# Patient Record
Sex: Female | Born: 1940 | Race: White | Hispanic: No | Smoking: Former smoker
Health system: Southern US, Community
[De-identification: ages and names within clinical notes are randomized; demographics above are authoritative.]

## PROBLEM LIST (undated history)

## (undated) DIAGNOSIS — Z972 Presence of dental prosthetic device (complete) (partial): Secondary | ICD-10-CM

## (undated) DIAGNOSIS — R21 Rash and other nonspecific skin eruption: Secondary | ICD-10-CM

## (undated) DIAGNOSIS — L930 Discoid lupus erythematosus: Secondary | ICD-10-CM

## (undated) DIAGNOSIS — D649 Anemia, unspecified: Secondary | ICD-10-CM

## (undated) DIAGNOSIS — E785 Hyperlipidemia, unspecified: Secondary | ICD-10-CM

## (undated) DIAGNOSIS — I82409 Acute embolism and thrombosis of unspecified deep veins of unspecified lower extremity: Secondary | ICD-10-CM

## (undated) DIAGNOSIS — R011 Cardiac murmur, unspecified: Secondary | ICD-10-CM

## (undated) DIAGNOSIS — M1990 Unspecified osteoarthritis, unspecified site: Secondary | ICD-10-CM

## (undated) DIAGNOSIS — Z8669 Personal history of other diseases of the nervous system and sense organs: Secondary | ICD-10-CM

## (undated) DIAGNOSIS — I10 Essential (primary) hypertension: Secondary | ICD-10-CM

## (undated) DIAGNOSIS — D689 Coagulation defect, unspecified: Secondary | ICD-10-CM

## (undated) HISTORY — DX: Unspecified osteoarthritis, unspecified site: M19.90

## (undated) HISTORY — DX: Personal history of other diseases of the nervous system and sense organs: Z86.69

## (undated) HISTORY — PX: EYE SURGERY: SHX253

## (undated) HISTORY — DX: Hyperlipidemia, unspecified: E78.5

## (undated) HISTORY — DX: Discoid lupus erythematosus: L93.0

## (undated) HISTORY — DX: Acute embolism and thrombosis of unspecified deep veins of unspecified lower extremity: I82.409

## (undated) HISTORY — DX: Anemia, unspecified: D64.9

## (undated) HISTORY — DX: Essential (primary) hypertension: I10

## (undated) HISTORY — DX: Rash and other nonspecific skin eruption: R21

## (undated) HISTORY — DX: Coagulation defect, unspecified: D68.9

---

## 1974-09-27 DIAGNOSIS — T8859XA Other complications of anesthesia, initial encounter: Secondary | ICD-10-CM

## 1974-09-27 HISTORY — DX: Other complications of anesthesia, initial encounter: T88.59XA

## 1983-09-28 HISTORY — PX: ABDOMINAL HYSTERECTOMY: SHX81

## 1985-09-27 HISTORY — PX: CHOLECYSTECTOMY: SHX55

## 1987-09-28 HISTORY — PX: OTHER SURGICAL HISTORY: SHX169

## 2005-02-01 ENCOUNTER — Ambulatory Visit: Payer: Self-pay | Admitting: Internal Medicine

## 2005-02-12 ENCOUNTER — Emergency Department: Payer: Self-pay | Admitting: Unknown Physician Specialty

## 2005-09-27 HISTORY — PX: KNEE ARTHROSCOPY: SUR90

## 2006-05-07 IMAGING — CR PELVIS - 1-2 VIEW
1 series · 1 of 1 positions shown · non-contrast
Comparison: none

REASON FOR EXAM: Injury, pain
COMMENTS:

PROCEDURE:     DXR - DXR PELVIS AP ONLY  - February 12, 2005  [DATE]
RESULT:     A single AP view reveals the pelvic girdle to be intact. No
acute fractures are noted.

[view not recorded]
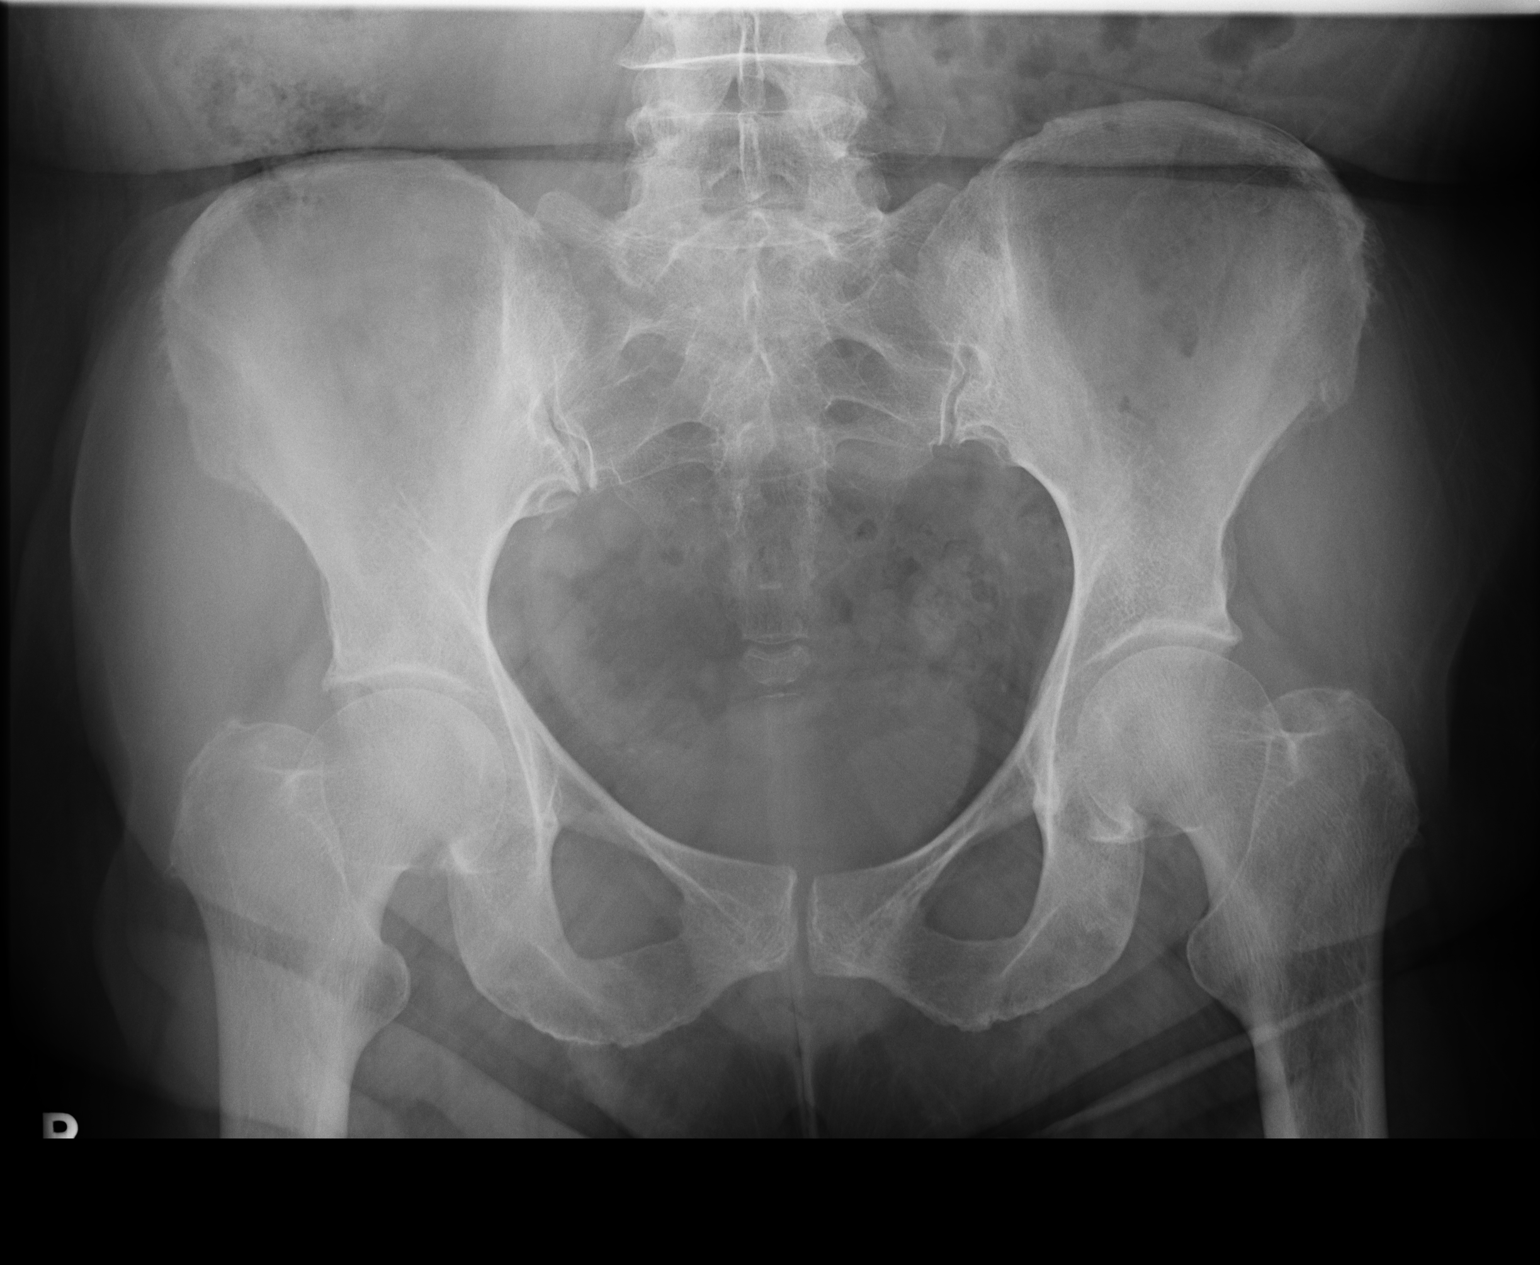

[1 of 1 positions shown; findings below may reference images not displayed]

IMPRESSION: No fracture is seen of the pelvis.

## 2006-05-07 IMAGING — CR RIGHT HIP - COMPLETE 2+ VIEW
1 series · 3 of 3 positions shown · non-contrast
Comparison: none

REASON FOR EXAM: injury, pain
COMMENTS:

PROCEDURE:     DXR - DXR HIP RIGHT COMPLETE  - February 12, 2005  [DATE]
RESULT:      AP and lateral view reveals the RIGHT hip to be intact.  No
fractures are identified.  The joint space is intact.

[Series 1: view not recorded · 0.17mm/px · 3 of 3 slices shown]
[im 1/3]
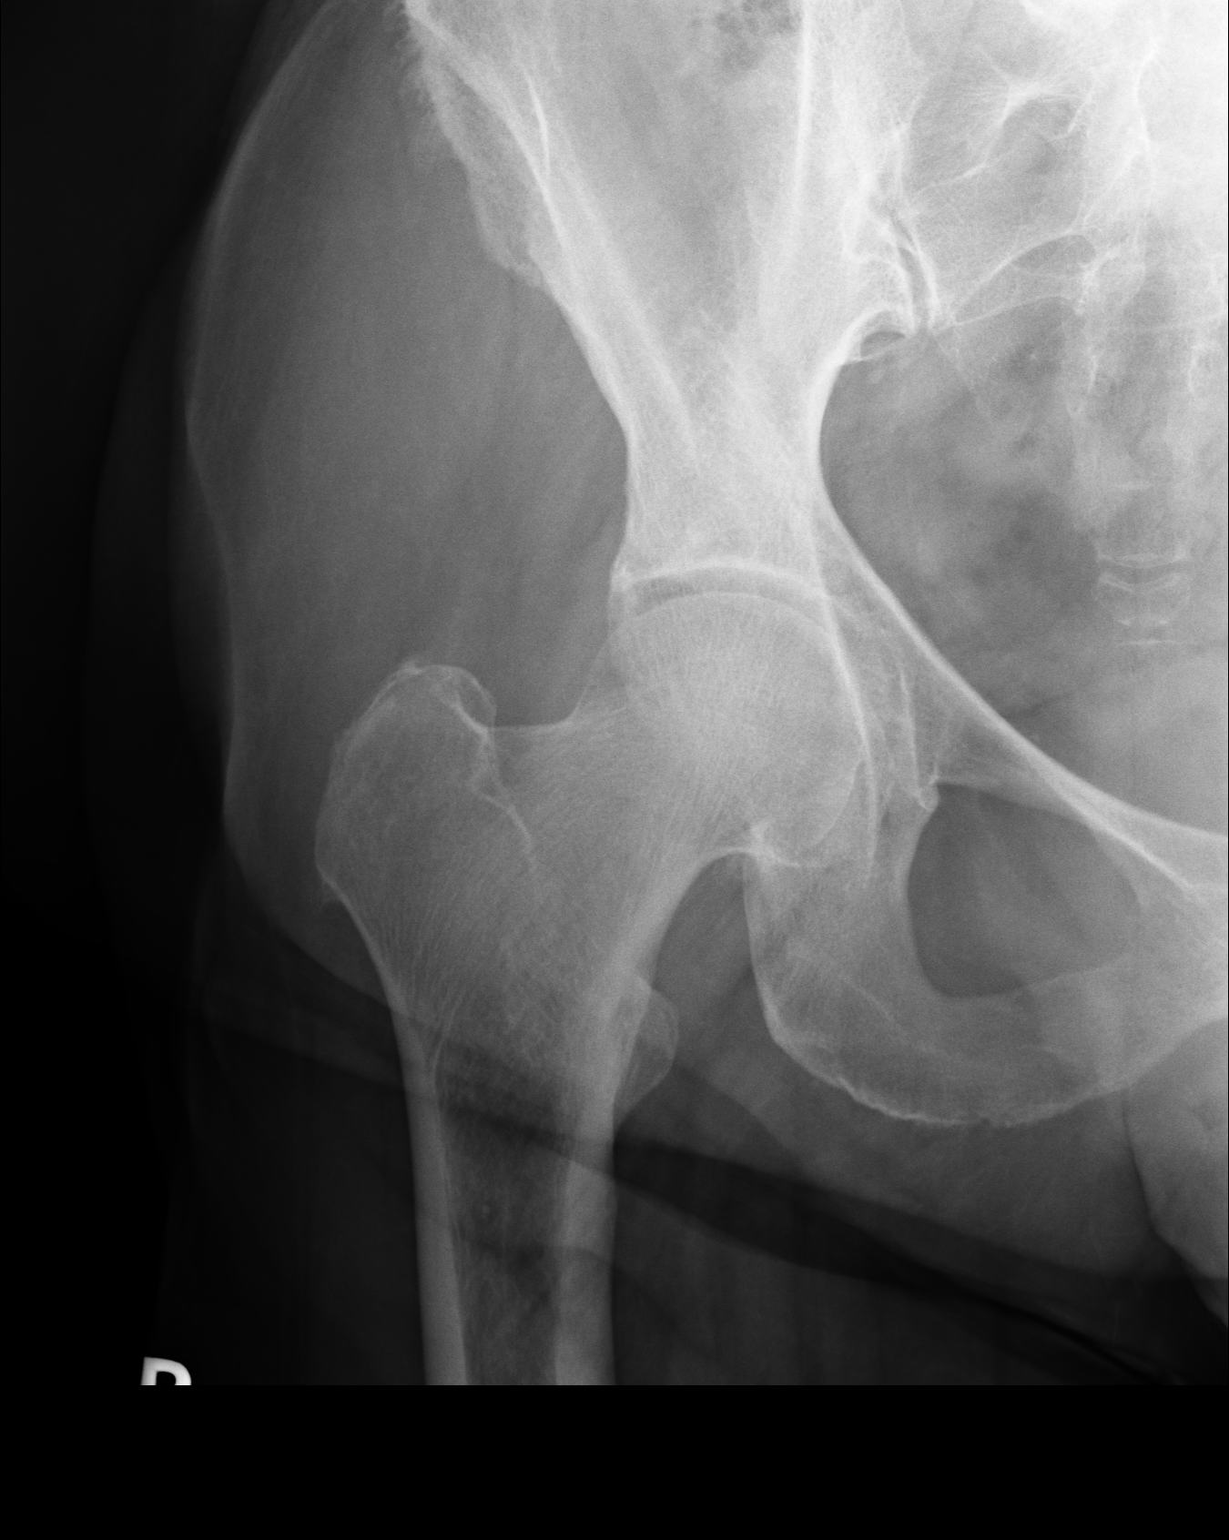
[im 2/3]
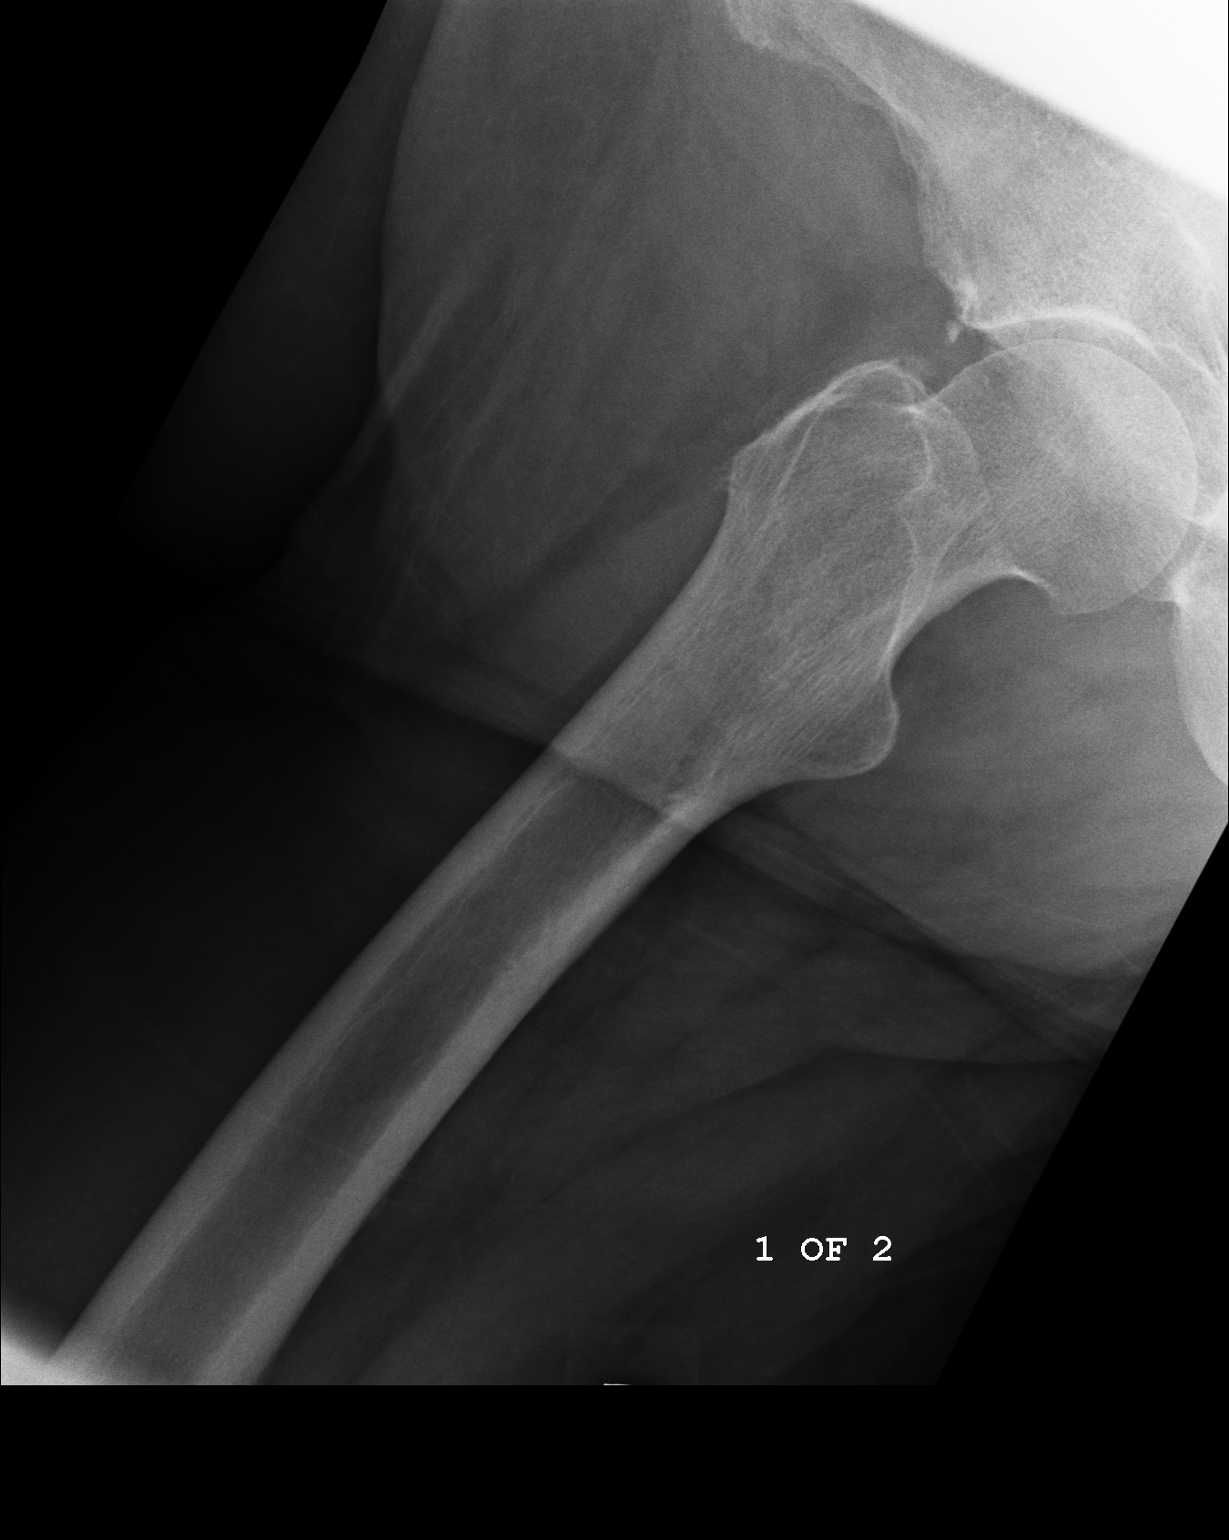
[im 3/3]
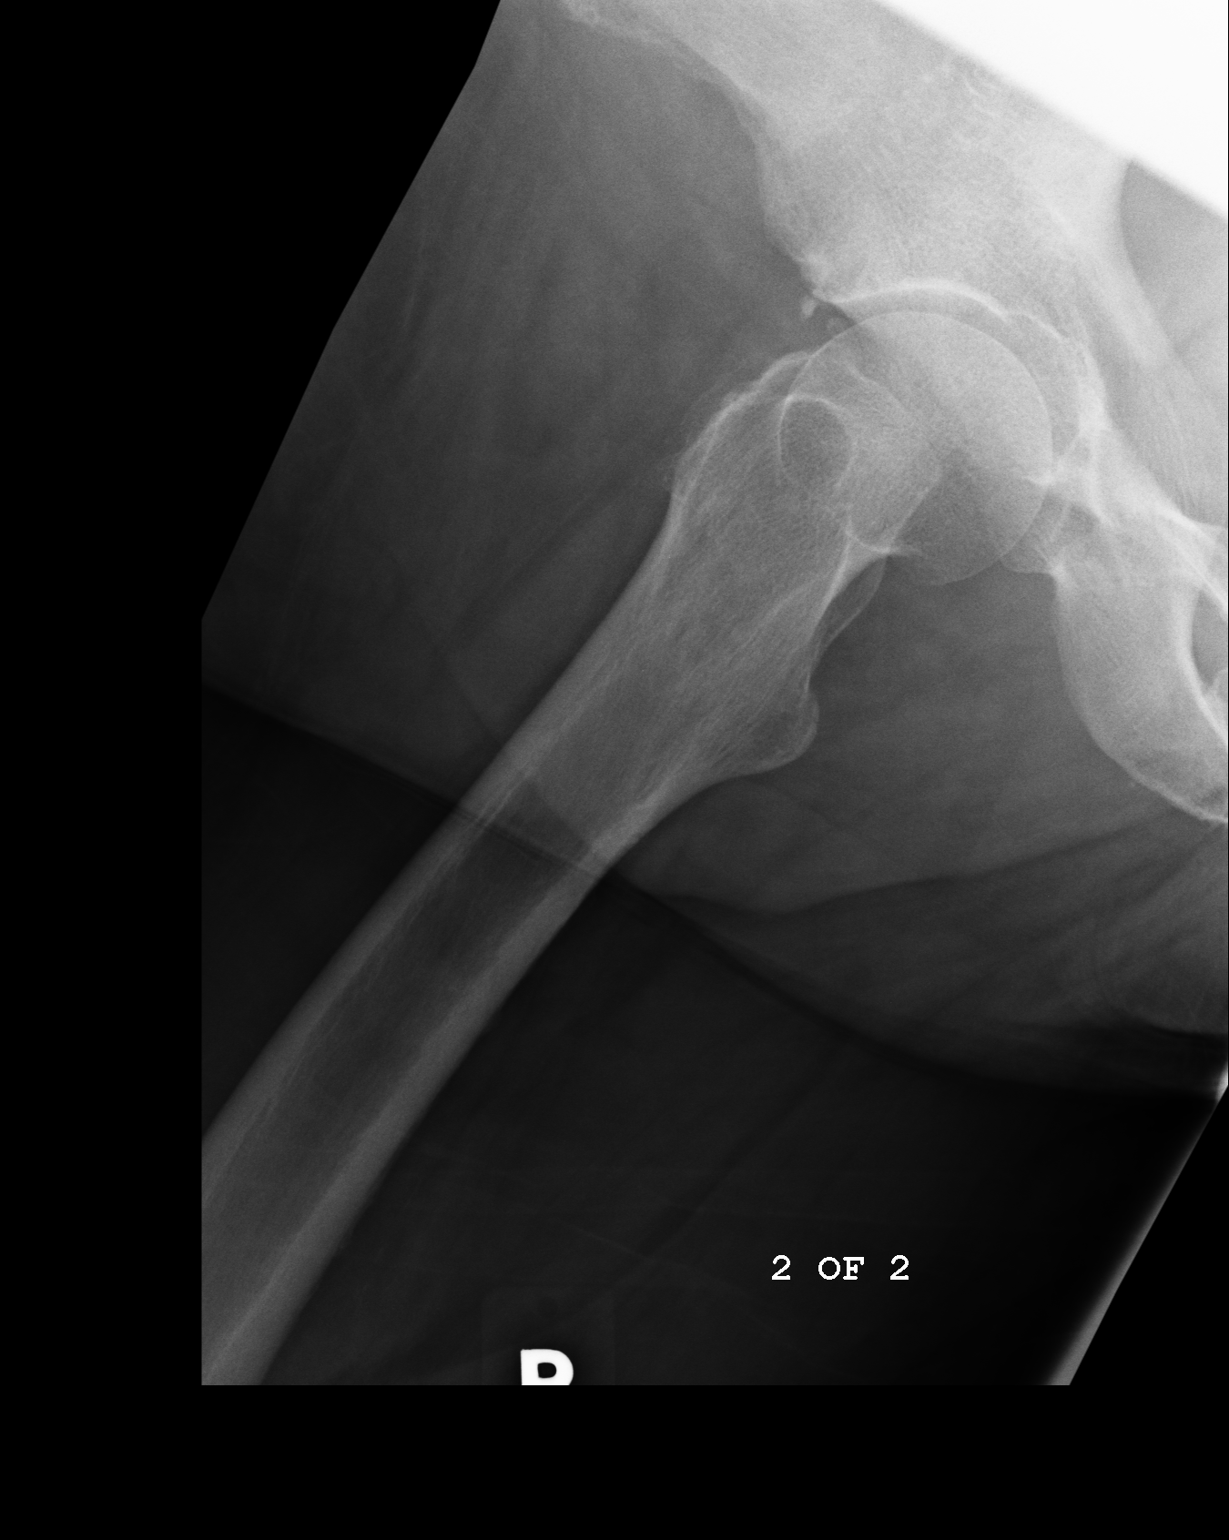

[3 of 3 positions shown; findings below may reference images not displayed]

IMPRESSION: No acute fracture noted of the RIGHT hip.

## 2006-05-07 IMAGING — CR DG KNEE COMPLETE 4+V*R*
1 series · 4 of 4 positions shown · non-contrast
Comparison: none

REASON FOR EXAM: injury, pain
COMMENTS:

PROCEDURE:     DXR - DXR KNEE RT COMP WITH OBLIQUES  - February 12, 2005  [DATE]
RESULT:       Four views reveals no fractures or dislocations. The joint
space is intact.

[Series 1: view not recorded · 0.17mm/px · 4 of 4 slices shown]
[im 1/4]
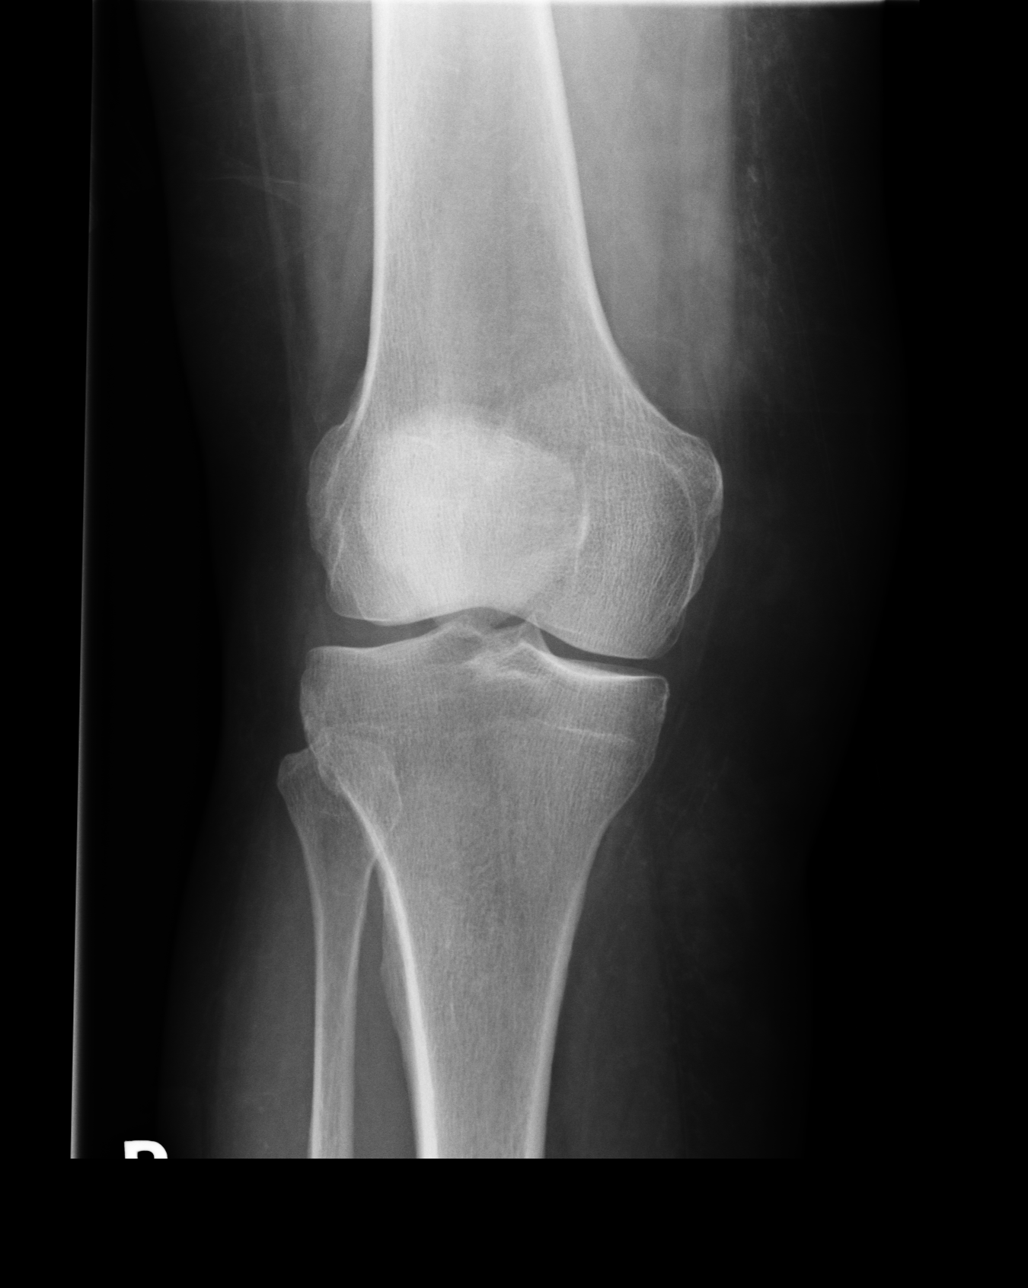
[im 2/4]
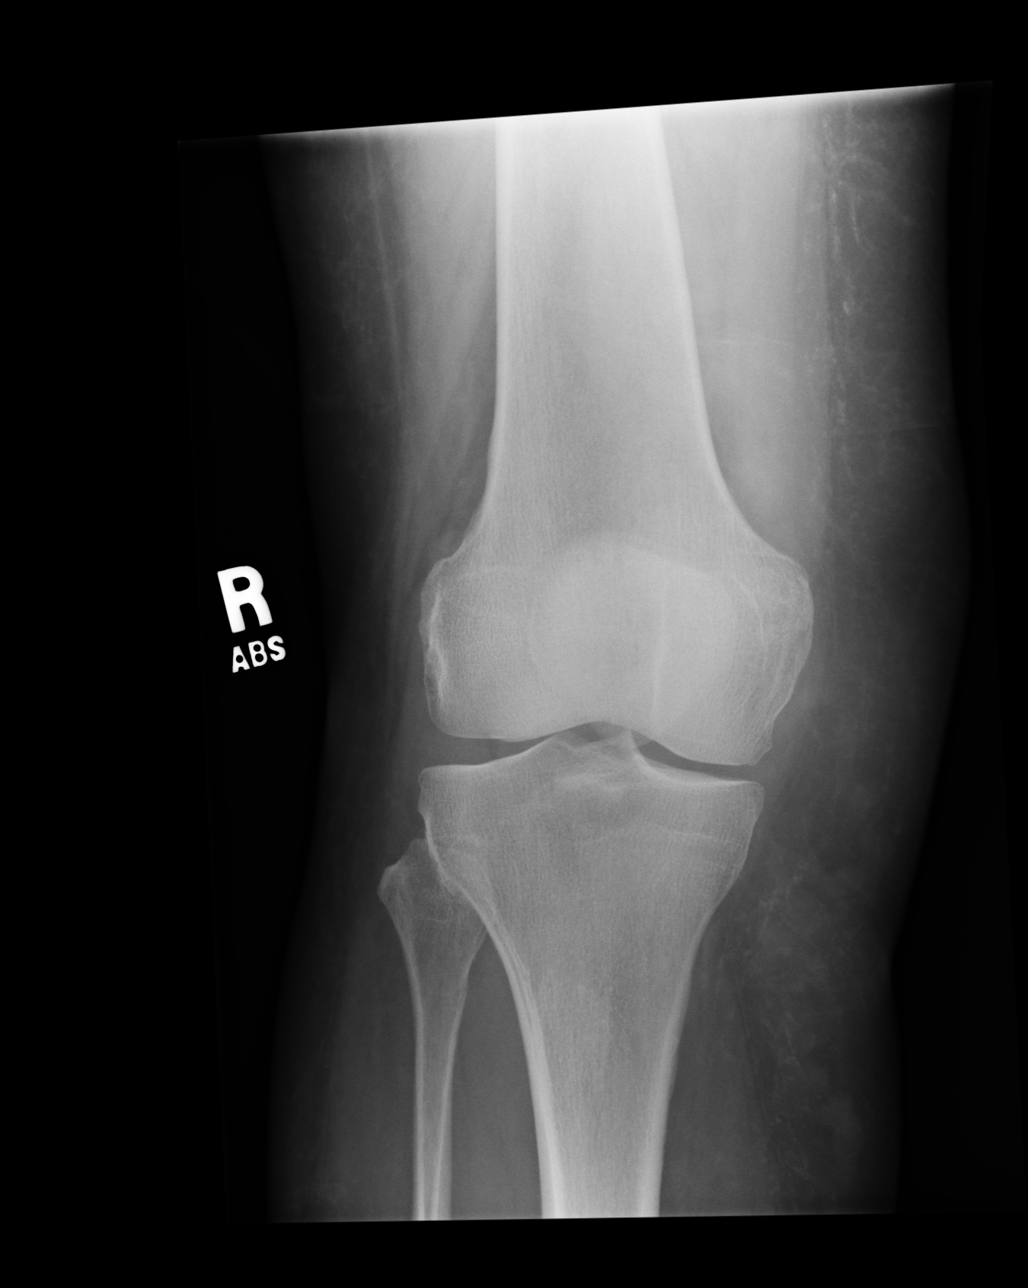
[im 3/4]
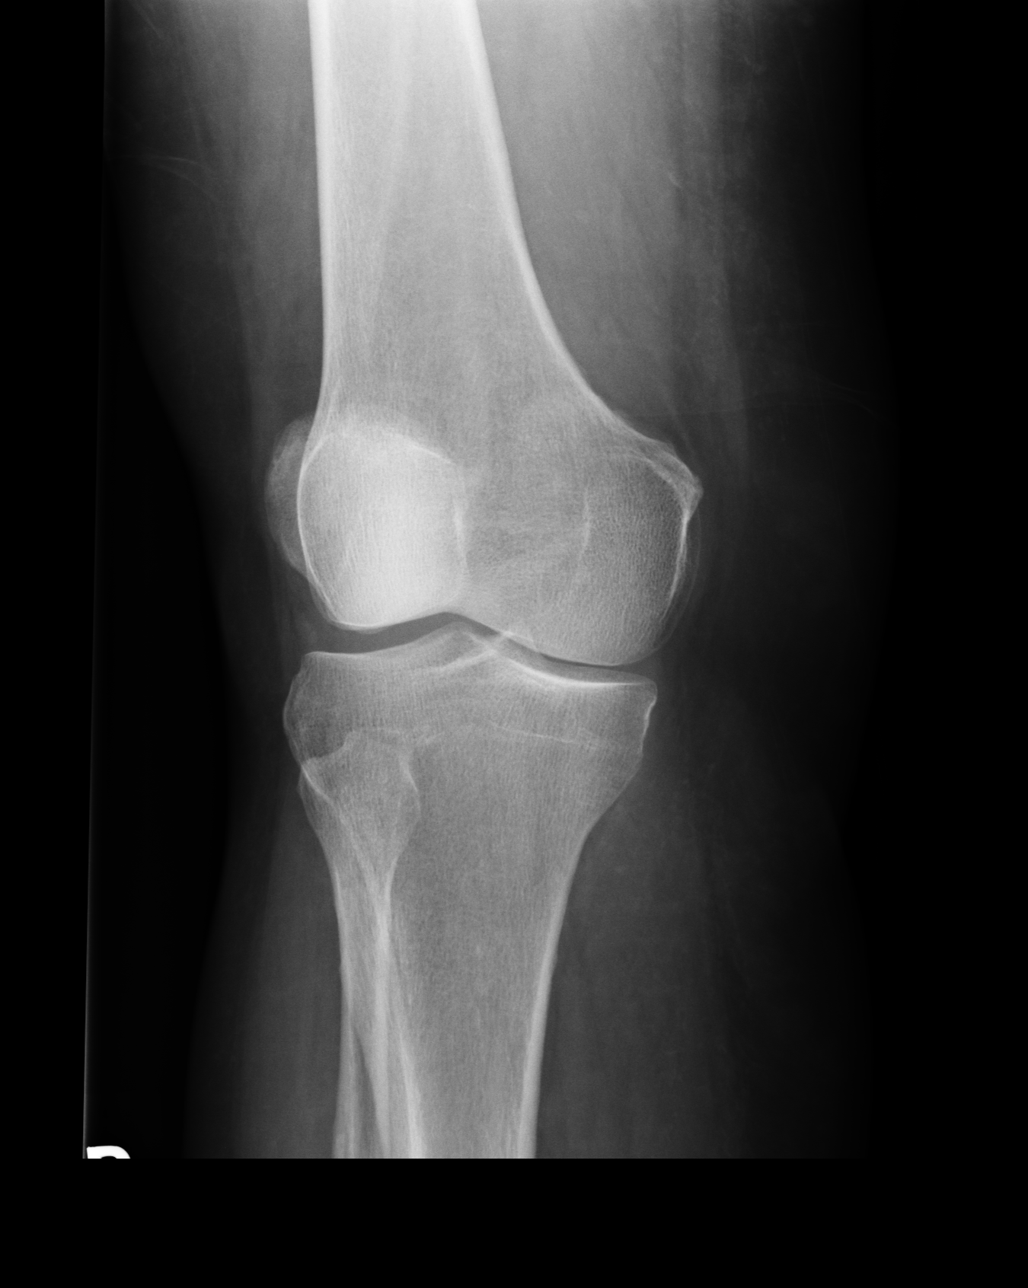
[im 4/4]
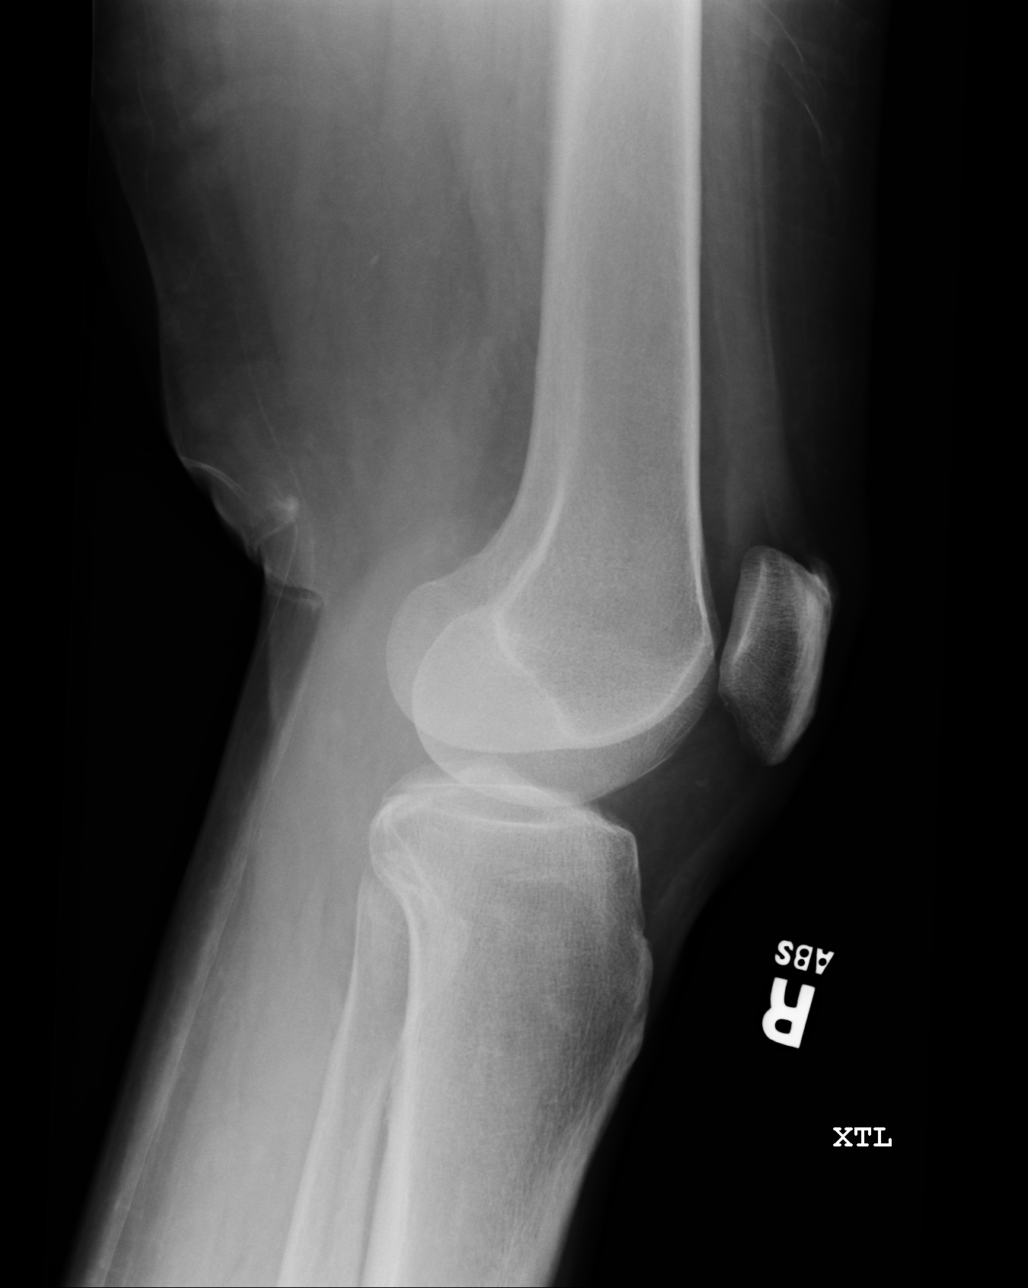

[4 of 4 positions shown; findings below may reference images not displayed]

IMPRESSION: No acute fracture seen of the RIGHT knee.

## 2006-06-20 ENCOUNTER — Emergency Department: Payer: Self-pay | Admitting: Emergency Medicine

## 2006-07-19 ENCOUNTER — Ambulatory Visit (HOSPITAL_BASED_OUTPATIENT_CLINIC_OR_DEPARTMENT_OTHER): Admission: RE | Admit: 2006-07-19 | Discharge: 2006-07-19 | Payer: Self-pay | Admitting: Orthopedic Surgery

## 2007-09-28 HISTORY — PX: KNEE ARTHROSCOPY: SUR90

## 2007-10-24 ENCOUNTER — Ambulatory Visit: Payer: Self-pay | Admitting: Internal Medicine

## 2007-12-05 ENCOUNTER — Encounter: Payer: Self-pay | Admitting: Internal Medicine

## 2007-12-27 ENCOUNTER — Encounter: Payer: Self-pay | Admitting: Internal Medicine

## 2008-12-10 ENCOUNTER — Ambulatory Visit: Payer: Self-pay | Admitting: Internal Medicine

## 2009-06-10 ENCOUNTER — Ambulatory Visit: Payer: Self-pay | Admitting: Internal Medicine

## 2010-09-16 ENCOUNTER — Ambulatory Visit: Payer: Self-pay | Admitting: Internal Medicine

## 2011-05-14 ENCOUNTER — Encounter: Payer: Self-pay | Admitting: Internal Medicine

## 2011-05-26 ENCOUNTER — Encounter: Payer: Self-pay | Admitting: Internal Medicine

## 2011-05-26 ENCOUNTER — Ambulatory Visit (INDEPENDENT_AMBULATORY_CARE_PROVIDER_SITE_OTHER): Payer: Medicare Other | Admitting: Internal Medicine

## 2011-05-26 DIAGNOSIS — M129 Arthropathy, unspecified: Secondary | ICD-10-CM

## 2011-05-26 DIAGNOSIS — I10 Essential (primary) hypertension: Secondary | ICD-10-CM | POA: Insufficient documentation

## 2011-05-26 DIAGNOSIS — M1990 Unspecified osteoarthritis, unspecified site: Secondary | ICD-10-CM | POA: Insufficient documentation

## 2011-05-26 DIAGNOSIS — R5383 Other fatigue: Secondary | ICD-10-CM | POA: Insufficient documentation

## 2011-05-26 DIAGNOSIS — IMO0002 Reserved for concepts with insufficient information to code with codable children: Secondary | ICD-10-CM | POA: Insufficient documentation

## 2011-05-26 DIAGNOSIS — N329 Bladder disorder, unspecified: Secondary | ICD-10-CM

## 2011-05-26 DIAGNOSIS — Z79899 Other long term (current) drug therapy: Secondary | ICD-10-CM | POA: Insufficient documentation

## 2011-05-26 LAB — COMPREHENSIVE METABOLIC PANEL
ALT: 13 U/L (ref 0–35)
AST: 17 U/L (ref 0–37)
Albumin: 4.1 g/dL (ref 3.5–5.2)
BUN: 11 mg/dL (ref 6–23)
Calcium: 9.6 mg/dL (ref 8.4–10.5)
Chloride: 103 mEq/L (ref 96–112)
Potassium: 3.9 mEq/L (ref 3.5–5.1)

## 2011-05-26 LAB — SEDIMENTATION RATE: Sed Rate: 19 mm/hr (ref 0–22)

## 2011-05-26 MED ORDER — LISINOPRIL 20 MG PO TABS: 40.0000 mg | ORAL_TABLET | Freq: Every day | ORAL | Status: DC

## 2011-05-26 NOTE — Assessment & Plan Note (Signed)
seocndary to overuse, involving both wrists and thumbs with prior steroid injections and wrist splints providing no relief and acutally aggravating her pain..  She does not desire orthopedic eval at this tine.  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

## 2011-05-26 NOTE — Patient Instructions (Signed)
If your wrist pain worsens or your bladder pain worsens, please call the office. We will call you with your lab results. Return in December for a physical .

## 2011-05-26 NOTE — Assessment & Plan Note (Signed)
Checking lytes , liver and kidney function today for chronic use of ACE I , HCTZ and red yeast rice.

## 2011-05-26 NOTE — Assessment & Plan Note (Signed)
Not well controlled by recent home checks,  Incresae lisinopril to 40 mg daily and continue hctz 25 mg daily

## 2011-05-26 NOTE — Assessment & Plan Note (Signed)
Chronic, with 70 lb weight loss over 5 years, good sleep habits,  checking TSH

## 2011-05-26 NOTE — Progress Notes (Signed)
  Subjective:    Patient ID: Breanna Hodge, female    DOB: 1940-11-29, 70 y.o.   MRN: [id number]  HPI  70 yr old white female with histoyr of HTN , bladder prolapse s.p mesh repair over 2 0 yrs ago presents for followup on chronoic complaints.  Left wrist,  bladder pain,  diarrhea now improved since stopping b12 supplements and avoiding gluten.  Weight loss of 70 lbs now stabilized.  Husband is on dailysis,  She is taking care of one grandchild.  Sleeping 7 hours/night.    Review of Systems  Constitutional: Positive for fatigue. Negative for fever, chills and unexpected weight change.  HENT: Negative for hearing loss, ear pain, nosebleeds, congestion, sore throat, facial swelling, rhinorrhea, sneezing, mouth sores, trouble swallowing, neck pain, neck stiffness, voice change, postnasal drip, sinus pressure, tinnitus and ear discharge.   Eyes: Negative for pain, discharge, redness and visual disturbance.  Respiratory: Negative for cough, chest tightness, shortness of breath, wheezing and stridor.   Cardiovascular: Negative for chest pain, palpitations and leg swelling.  Gastrointestinal: Negative for blood in stool.  Musculoskeletal: Positive for arthralgias. Negative for myalgias.  Skin: Negative for color change and rash.  Neurological: Positive for facial asymmetry. Negative for dizziness, weakness, light-headedness and headaches.  Hematological: Negative for adenopathy.       Objective:   Physical Exam  Constitutional: She is oriented to person, place, and time. She appears well-developed and well-nourished.  HENT:  Mouth/Throat: Oropharynx is clear and moist.  Eyes: EOM are normal. Pupils are equal, round, and reactive to light. No scleral icterus.  Neck: Normal range of motion. Neck supple. No JVD present. No thyromegaly present.  Cardiovascular: Normal rate, regular rhythm and intact distal pulses.  Exam reveals gallop.   Pulmonary/Chest: Effort normal and breath sounds  normal.  Abdominal: Soft. Bowel sounds are normal. She exhibits no mass. There is no tenderness.  Musculoskeletal: She exhibits edema.       Arms:      Trace , RLE  Lymphadenopathy:    She has no cervical adenopathy.  Neurological: She is alert and oriented to person, place, and time.  Skin: Skin is warm and dry. Rash noted.       hyperigmented macular rash RLE on shin  Psychiatric: She has a normal mood and affect. Judgment and thought content normal.          Assessment & Plan:

## 2011-05-26 NOTE — Assessment & Plan Note (Signed)
She had an E Coli UTI in April, now resolved,  But has recurrent bladder pain which she attributes to prior mesh placement 20 yrs ago which she believes has fallen.  She wants to defer urologic evaluation until January and prefers to see Dr. Achilles Dunk since he has cared for her husband. Will initiate referral in Dec during her annual physical.

## 2011-06-09 ENCOUNTER — Encounter: Payer: Self-pay | Admitting: Internal Medicine

## 2011-07-02 ENCOUNTER — Ambulatory Visit (INDEPENDENT_AMBULATORY_CARE_PROVIDER_SITE_OTHER): Payer: Medicare Other | Admitting: Internal Medicine

## 2011-07-02 DIAGNOSIS — Z23 Encounter for immunization: Secondary | ICD-10-CM

## 2012-02-13 ENCOUNTER — Other Ambulatory Visit: Payer: Self-pay | Admitting: Internal Medicine

## 2012-03-28 ENCOUNTER — Encounter: Payer: Self-pay | Admitting: Internal Medicine

## 2012-03-28 ENCOUNTER — Ambulatory Visit (INDEPENDENT_AMBULATORY_CARE_PROVIDER_SITE_OTHER): Payer: Medicare Other | Admitting: Internal Medicine

## 2012-03-28 VITALS — BP 144/82 | HR 80 | Temp 98.0°F | Resp 16 | Wt 175.5 lb

## 2012-03-28 DIAGNOSIS — R5383 Other fatigue: Secondary | ICD-10-CM

## 2012-03-28 DIAGNOSIS — Z23 Encounter for immunization: Secondary | ICD-10-CM

## 2012-03-28 DIAGNOSIS — Z1239 Encounter for other screening for malignant neoplasm of breast: Secondary | ICD-10-CM

## 2012-03-28 DIAGNOSIS — I10 Essential (primary) hypertension: Secondary | ICD-10-CM

## 2012-03-28 DIAGNOSIS — E785 Hyperlipidemia, unspecified: Secondary | ICD-10-CM

## 2012-03-28 MED ORDER — HYDROCHLOROTHIAZIDE 25 MG PO TABS: 25.0000 mg | ORAL_TABLET | Freq: Every day | ORAL | Status: DC

## 2012-03-28 MED ORDER — LISINOPRIL 20 MG PO TABS: 40.0000 mg | ORAL_TABLET | Freq: Every day | ORAL | Status: DC

## 2012-03-28 NOTE — Patient Instructions (Addendum)
Return in a fasting state for your labs   We gave you the pneumovax vaccine today and will check on your TdaP status .

## 2012-03-28 NOTE — Progress Notes (Signed)
Patient ID: Breanna Hodge, female   DOB: Oct 25, 1940, 71 y.o.   MRN: [id number]  Subjective:    Patient here for follow-up of elevated blood pressure.  She is not exercising and is adherent to a low-salt diet.  Blood pressure is well controlled at home. Cardiac symptoms: none. Patient denies: claudication, dyspnea, exertional chest pressure/discomfort, lower extremity edema, orthopnea and palpitations. Cardiovascular risk factors: advanced age (older than 60 for men, 38 for women) and dyslipidemia. Use of agents associated with hypertension: none. History of target organ damage: none.  The following portions of the patient's history were reviewed and updated as appropriate: allergies, current medications, past family history, past medical history, past social history, past surgical history and problem list.  Review of Systems Musculoskeletal:positive for arthralgias and stiff joints     Objective:    BP 144/82  Pulse 80  Temp 98 F (36.7 C) (Oral)  Resp 16  Wt 175 lb 8 oz (79.606 kg)  SpO2 97% General appearance: alert, cooperative and appears stated age Neck: no adenopathy, no carotid bruit, no JVD, supple, symmetrical, trachea midline and thyroid not enlarged, symmetric, no tenderness/mass/nodules Lungs: clear to auscultation bilaterally Heart: regular rate and rhythm, S1, S2 normal, no murmur, click, rub or gallop Extremities: extremities normal, atraumatic, no cyanosis or edema Pulses: 2+ and symmetric    Assessment:    Hypertension, stage 2 Evidence of target organ damage: none.    Plan:    Medication: no change.

## 2012-04-04 ENCOUNTER — Ambulatory Visit: Payer: Self-pay | Admitting: Internal Medicine

## 2012-04-05 ENCOUNTER — Telehealth: Payer: Self-pay | Admitting: Internal Medicine

## 2012-04-05 NOTE — Telephone Encounter (Signed)
Her mammogram was normal.  Repeat in one year 

## 2012-04-06 NOTE — Telephone Encounter (Signed)
Left message asking patient to return my call.

## 2012-04-07 NOTE — Telephone Encounter (Signed)
Patient notified

## 2012-04-08 LAB — HM MAMMOGRAPHY: HM Mammogram: NORMAL

## 2012-04-18 ENCOUNTER — Encounter: Payer: Self-pay | Admitting: Internal Medicine

## 2012-05-09 ENCOUNTER — Encounter: Payer: Self-pay | Admitting: Internal Medicine

## 2012-05-09 ENCOUNTER — Ambulatory Visit (INDEPENDENT_AMBULATORY_CARE_PROVIDER_SITE_OTHER): Payer: Medicare Other | Admitting: Internal Medicine

## 2012-05-09 VITALS — BP 130/76 | HR 76 | Temp 97.6°F | Resp 18 | Ht 66.5 in | Wt 172.2 lb

## 2012-05-09 DIAGNOSIS — IMO0002 Reserved for concepts with insufficient information to code with codable children: Secondary | ICD-10-CM

## 2012-05-09 DIAGNOSIS — Z79899 Other long term (current) drug therapy: Secondary | ICD-10-CM

## 2012-05-09 DIAGNOSIS — Z1211 Encounter for screening for malignant neoplasm of colon: Secondary | ICD-10-CM

## 2012-05-09 DIAGNOSIS — E559 Vitamin D deficiency, unspecified: Secondary | ICD-10-CM

## 2012-05-09 DIAGNOSIS — R5383 Other fatigue: Secondary | ICD-10-CM

## 2012-05-09 DIAGNOSIS — N8111 Cystocele, midline: Secondary | ICD-10-CM

## 2012-05-09 DIAGNOSIS — E785 Hyperlipidemia, unspecified: Secondary | ICD-10-CM

## 2012-05-09 DIAGNOSIS — I10 Essential (primary) hypertension: Secondary | ICD-10-CM

## 2012-05-09 DIAGNOSIS — R5381 Other malaise: Secondary | ICD-10-CM

## 2012-05-09 LAB — COMPREHENSIVE METABOLIC PANEL
Albumin: 4.2 g/dL (ref 3.5–5.2)
Alkaline Phosphatase: 48 U/L (ref 39–117)
BUN: 14 mg/dL (ref 6–23)
Calcium: 9.7 mg/dL (ref 8.4–10.5)
Creatinine, Ser: 0.6 mg/dL (ref 0.4–1.2)
Glucose, Bld: 97 mg/dL (ref 70–99)
Potassium: 3.2 mEq/L — ABNORMAL LOW (ref 3.5–5.1)

## 2012-05-09 LAB — CBC WITH DIFFERENTIAL/PLATELET
Basophils Relative: 0.7 % (ref 0.0–3.0)
Eosinophils Absolute: 0.4 10*3/uL (ref 0.0–0.7)
Eosinophils Relative: 4 % (ref 0.0–5.0)
HCT: 44.3 % (ref 36.0–46.0)
Hemoglobin: 14.6 g/dL (ref 12.0–15.0)
MCHC: 33 g/dL (ref 30.0–36.0)
MCV: 93.7 fl (ref 78.0–100.0)
Monocytes Absolute: 0.9 10*3/uL (ref 0.1–1.0)
Neutro Abs: 5.9 10*3/uL (ref 1.4–7.7)
Neutrophils Relative %: 56.7 % (ref 43.0–77.0)
RBC: 4.72 Mil/uL (ref 3.87–5.11)
WBC: 10.3 10*3/uL (ref 4.5–10.5)

## 2012-05-09 LAB — LIPID PANEL: HDL: 53.3 mg/dL (ref >39.00)

## 2012-05-09 LAB — TSH: TSH: 1.57 u[IU]/mL (ref 0.35–5.50)

## 2012-05-09 NOTE — Patient Instructions (Addendum)
Please complete the take home stool cards as your annual screening test for colon CA  We will call you with the result of your blood work today and whether you need to get the TdaP vaccine at the health Dept   Please consider letting us schedule you for a Bone Density test

## 2012-05-10 ENCOUNTER — Other Ambulatory Visit: Payer: Self-pay | Admitting: *Deleted

## 2012-05-10 DIAGNOSIS — Z1211 Encounter for screening for malignant neoplasm of colon: Secondary | ICD-10-CM | POA: Insufficient documentation

## 2012-05-10 LAB — VITAMIN D 25 HYDROXY (VIT D DEFICIENCY, FRACTURES): Vit D, 25-Hydroxy: 47 ng/mL (ref 30–89)

## 2012-05-10 LAB — LDL CHOLESTEROL, DIRECT: Direct LDL: 189.9 mg/dL

## 2012-05-10 MED ORDER — POTASSIUM CHLORIDE CRYS ER 20 MEQ PO TBCR: 20.0000 meq | EXTENDED_RELEASE_TABLET | Freq: Every day | ORAL | Status: DC

## 2012-05-10 NOTE — Assessment & Plan Note (Signed)
Patient states thta her blood pressures at home been consistently 130/80. She has historically been resistant to adding additional medications given this fact. No changes were made today.

## 2012-05-10 NOTE — Assessment & Plan Note (Signed)
She has avoided screening for colon cancer since age 71. No prior colonoscopy. Reasons include persistently poor health of husband and his frequent medical visits. I discussed starting with take home stool cards which she has agreed to.

## 2012-05-10 NOTE — Assessment & Plan Note (Signed)
She is due for fasting labs and CMET. Thyroid and CBC will also be checked.

## 2012-05-10 NOTE — Assessment & Plan Note (Signed)
System despite 2 prior procedures she still has cystocele noted on exam. She does not want additional intervention at this time.

## 2012-05-10 NOTE — Progress Notes (Signed)
Patient ID: Breanna Hodge, female   DOB: 07-20-1941, 71 y.o.   MRN: [id number]  The patient is here for annual Medicare wellness examination and management of other chronic and acute problems.   The risk factors are reflected in the social history.  The roster of all physicians providing medical care to patient - is listed in the Snapshot section of the chart.  Activities of daily living:  The patient is 100% independent in all ADLs: dressing, toileting, feeding as well as independent mobility  Home safety : The patient has smoke detectors in the home. They wear seatbelts.  There are no firearms at home. There is no violence in the home.   There is no risks for hepatitis, STDs or HIV. There is no   history of blood transfusion. They have no travel history to infectious disease endemic areas of the world.  The patient has seen their dentist in the last six month. They have seen their eye doctor in the last year. They admit to slight hearing difficulty with regard to whispered voices and some television programs.  They have deferred audiologic testing in the last year.  They do not  have excessive sun exposure. Discussed the need for sun protection: hats, long sleeves and use of sunscreen if there is significant sun exposure.   Diet: the importance of a healthy diet is discussed. They do have a healthy diet.  The benefits of regular aerobic exercise were discussed. She walks 4 times per week ,  20 minutes.   Depression screen: there are no signs or vegative symptoms of depression- irritability, change in appetite, anhedonia, sadness/tearfullness.  Cognitive assessment: the patient manages all their financial and personal affairs and is actively engaged. They could relate day,date,year and events; recalled 2/3 objects at 3 minutes; performed clock-face test normally.  The following portions of the patient's history were reviewed and updated as appropriate: allergies, current medications,  past family history, past medical history,  past surgical history, past social history  and problem list.  Visual acuity was not assessed per patient preference since she has regular follow up with her ophthalmologist. Hearing and body mass index were assessed and reviewed.   During the course of the visit the patient was educated and counseled about appropriate screening and preventive services including : fall prevention , diabetes screening, nutrition counseling, colorectal cancer screening, and recommended immunizations.    Objective BP 130/76  Pulse 76  Temp 97.6 F (36.4 C) (Oral)  Resp 18  Ht 5' 6.5" (1.689 m)  Wt 172 lb 4 oz (78.132 kg)  BMI 27.39 kg/m2  SpO2 95%  General appearance: alert, cooperative and appears stated age Ears: normal TM's and external ear canals both ears Throat: lips, mucosa, and tongue normal; teeth and gums normal Neck: no adenopathy, no carotid bruit, supple, symmetrical, trachea midline and thyroid not enlarged, symmetric, no tenderness/mass/nodules Back: symmetric, no curvature. ROM normal. No CVA tenderness. Breast: Symmetric no masses or nipple inversion. Lungs: clear to auscultation bilaterally Heart: regular rate and rhythm, S1, S2 normal, no murmur, click, rub or gallop Abdomen: soft, non-tender; bowel sounds normal; no masses,  no organomegaly GYN: Bladder cystocele noted in vaginal introitus. Vagina normal. Patient is status post hysterectomy. Ovaries nonpalpable. Pulses: 2+ and symmetric Skin: Skin color, texture, turgor normal. No rashes or lesions Lymph nodes: Cervical, supraclavicular, and axillary nodes normal.  Bladder cystocele System despite 2 prior procedures she still has cystocele noted on exam. She does not want additional intervention at this  time.   Encounter for long-term (current) use of other medications She is due for fasting labs and CMET. Thyroid and CBC will also be checked.  Hypertension  Patient states thta her  blood pressures at home been consistently 130/80. She has historically been resistant to adding additional medications given this fact. No changes were made today.  Screening for colon cancer She has avoided screening for colon cancer since age 48. No prior colonoscopy. Reasons include persistently poor health of husband and his frequent medical visits. I discussed starting with take home stool cards which she has agreed to.   Updated Medication List Outpatient Encounter Prescriptions as of 05/09/2012  Medication Sig Dispense Refill  . ALPHA LIPOIC ACID PO Take by mouth.      Marland Kitchen aspirin 81 MG tablet Take 81 mg by mouth daily.        . calcium-vitamin D (OSCAL 500/200 D-3) 500-200 MG-UNIT per tablet Take 2 tablets by mouth daily.       . Coenzyme Q10 (COQ-10) 200 MG CAPS 100 mg. Take one by mouth daily       . Glucosamine-Chondroit-Vit C-Mn (GLUCOSAMINE 1500 COMPLEX) CAPS Take one by mouth 2 times daily       . hydrochlorothiazide (HYDRODIURIL) 25 MG tablet Take 1 tablet (25 mg total) by mouth daily.  90 tablet  0  . KRILL OIL PO Take 50 mg's by mouth daily       . lisinopril (PRINIVIL,ZESTRIL) 20 MG tablet Take 2 tablets (40 mg total) by mouth daily.  90 tablet  3  . Multiple Vitamin (MULTIVITAMIN) tablet Take 1 tablet by mouth daily.      . Probiotic Product (PROBIOTIC FORMULA PO) Take by mouth.      . Red Yeast Rice 600 MG CAPS Take 2 capsules by mouth daily.        Marland Kitchen RESVERATROL 100 MG CAPS Take by mouth.      . DISCONTD: Cholecalciferol (VITAMIN D3) 1000 UNITS CAPS Take 1 tablet by mouth daily.        Marland Kitchen DISCONTD: gelatin 650 MG capsule Take 650 mg by mouth daily.      Marland Kitchen DISCONTD: Magnesium Oxide 500 MG TABS Take one by mouth daily       . DISCONTD: Misc Natural Products (OSTEO BI-FLEX ADV DOUBLE ST PO) Take 2 tablets by mouth daily.

## 2012-05-18 ENCOUNTER — Other Ambulatory Visit: Payer: Medicare Other

## 2012-05-18 ENCOUNTER — Other Ambulatory Visit: Payer: Self-pay | Admitting: Internal Medicine

## 2012-05-18 DIAGNOSIS — Z1211 Encounter for screening for malignant neoplasm of colon: Secondary | ICD-10-CM

## 2012-05-19 ENCOUNTER — Telehealth: Payer: Self-pay | Admitting: Internal Medicine

## 2012-05-19 NOTE — Telephone Encounter (Signed)
Caller: Breanna Hodge/Patient; Phone: [phone]; Reason for Call: Pt returning call to [facility]; please try her again at your earliest convenience.

## 2012-08-27 ENCOUNTER — Other Ambulatory Visit: Payer: Self-pay | Admitting: Internal Medicine

## 2013-01-23 ENCOUNTER — Other Ambulatory Visit: Payer: Self-pay | Admitting: Internal Medicine

## 2013-01-23 ENCOUNTER — Other Ambulatory Visit: Payer: Self-pay | Admitting: *Deleted

## 2013-01-23 MED ORDER — POTASSIUM CHLORIDE CRYS ER 20 MEQ PO TBCR: 20.0000 meq | EXTENDED_RELEASE_TABLET | Freq: Every day | ORAL | Status: DC

## 2013-03-05 ENCOUNTER — Ambulatory Visit (INDEPENDENT_AMBULATORY_CARE_PROVIDER_SITE_OTHER): Payer: Medicare Other | Admitting: General Surgery

## 2013-03-05 ENCOUNTER — Encounter: Payer: Self-pay | Admitting: General Surgery

## 2013-03-05 VITALS — BP 142/78 | HR 82 | Resp 14 | Ht 69.0 in | Wt 171.0 lb

## 2013-03-05 DIAGNOSIS — L0231 Cutaneous abscess of buttock: Secondary | ICD-10-CM

## 2013-03-05 DIAGNOSIS — L03317 Cellulitis of buttock: Secondary | ICD-10-CM

## 2013-03-05 MED ORDER — SULFAMETHOXAZOLE-TRIMETHOPRIM 800-160 MG PO TABS: 1.0000 | ORAL_TABLET | Freq: Two times a day (BID) | ORAL | Status: DC

## 2013-03-05 NOTE — Patient Instructions (Addendum)
Return in 2 weeks. Simple dressing to area -change daily and as needed.

## 2013-03-05 NOTE — Progress Notes (Signed)
Patient ID: Breanna Hodge, female   DOB: [birth year], 72 y.o.   MRN: [id number]  Chief Complaint  Patient presents with  . Other    gluteal abscess    HPI Breanna Hodge is a 72 y.o. female patient here for gluteal abscess. Patient reports slipping and hitting right buttocks about 3 weeks ago, the abscess developed after that. It has been increasing in size. She reports drainage from the area that is continuous but is usually a small amount this is clear with red streaking. Patient has used ice intitially when this occurred and has since switched to heat compresses.  HPI  Past Medical History  Diagnosis Date  . DVT (deep venous thrombosis)     RLE, associated with pregnancy  . Rash     recurring on RLE, reported as discoid lupus by patient per prior [facility] eval  . Hypertension   . Hyperlipidemia   . History of Bell's palsy   . Clotting disorder     DVT, RLE during pregnancy  . DLE (discoid lupus erythematosus)     diagnosed at [facility] by skin biopsy  . Arthritis   . H/O: Bell's palsy   . Anemia     Past Surgical History  Procedure Laterality Date  . Gyn surgery  1989    hysterectomy  . Cholecystectomy  1987  . Knee arthroscopy  2009    right  . Knee arthroscopy  2007    right knee  . Abdominal hysterectomy  1985    with bladder tack using mesh    Family History  Problem Relation Age of Onset  . Coronary artery disease Mother     CABG  . Cancer Father     leukemia  . Heart disease Brother     "heart problems"  . Lupus Daughter     Social History History  Substance Use Topics  . Smoking status: Never Smoker   . Smokeless tobacco: Never Used     Comment: Quit over 46 years ago  . Alcohol Use: No    Allergies  Allergen Reactions  . Naprosyn (Naproxen) Hives and Shortness Of Breath  . Gluten Meal     Current Outpatient Prescriptions  Medication Sig Dispense Refill  . aspirin 81 MG tablet Take 81 mg by mouth daily.        . B Complex Vitamins  (VITAMIN B COMPLEX) TABS Take 1 tablet by mouth daily.      . calcium-vitamin D (OSCAL 500/200 D-3) 500-200 MG-UNIT per tablet Take 2 tablets by mouth daily.       . Cholecalciferol (VITAMIN D3) 1000 UNITS CAPS Take 1 capsule by mouth daily.      . Coenzyme Q10 (COQ-10) 200 MG CAPS 100 mg. Take one by mouth daily       . RESVERATROL 100 MG CAPS Take by mouth.      . ALPHA LIPOIC ACID PO Take by mouth.      . Glucosamine-Chondroit-Vit C-Mn (GLUCOSAMINE 1500 COMPLEX) CAPS Take one by mouth 2 times daily       . hydrochlorothiazide (HYDRODIURIL) 25 MG tablet TAKE 1 TABLET BY MOUTH EVERY DAY  90 tablet  0  . KRILL OIL PO Take 50 mg's by mouth daily       . lisinopril (PRINIVIL,ZESTRIL) 20 MG tablet Take 2 tablets (40 mg total) by mouth daily.  90 tablet  3  . Multiple Vitamin (MULTIVITAMIN) tablet Take 1 tablet by mouth daily.      . potassium  chloride SA (K-DUR,KLOR-CON) 20 MEQ tablet Take 1 tablet (20 mEq total) by mouth daily.  30 tablet  0  . Probiotic Product (PROBIOTIC FORMULA PO) Take by mouth.      . Red Yeast Rice 600 MG CAPS Take 2 capsules by mouth daily.         No current facility-administered medications for this visit.    Review of Systems Review of Systems  Constitutional: Positive for chills. Negative for fever, diaphoresis, activity change, appetite change, fatigue and unexpected weight change.  Respiratory: Negative.   Cardiovascular: Negative.     There were no vitals taken for this visit.  Physical Exam Physical Exam  Constitutional: She is oriented to person, place, and time. She appears well-developed and well-nourished.  Neurological: She is alert and oriented to person, place, and time.   Sacral area- 7 cm indurated red area with central fluctuance and 2cm diam skin abrasion. Very tender Data Reviewed none  Assessment    Infected hematoma     Plan    Drainage performed with pt's consent     Area was prepped with betadine. 2 ml 1% xylocaine was used.  Cruciate incision made and 5 ml purulent old blood evvacuated. Corners of incision trimmed. 4/4 gauze dressing applied. No immediate problems noted. C/S sent. Pt to start on Septra DS.    Carron Brazen M 03/05/2013, 4:08 PM

## 2013-03-06 ENCOUNTER — Encounter: Payer: Self-pay | Admitting: General Surgery

## 2013-03-11 ENCOUNTER — Encounter: Payer: Self-pay | Admitting: General Surgery

## 2013-03-11 LAB — ANAEROBIC AND AEROBIC CULTURE

## 2013-03-11 NOTE — Progress Notes (Signed)
Patient ID: Breanna Hodge, female   DOB: 1940-10-23, 72 y.o.   MRN: [id number] Pt's C/S from her gluteal abscess grew MRSA.  Pt is on Septra and this should cover adequately.

## 2013-03-19 ENCOUNTER — Ambulatory Visit (INDEPENDENT_AMBULATORY_CARE_PROVIDER_SITE_OTHER): Payer: Medicare Other | Admitting: General Surgery

## 2013-03-19 ENCOUNTER — Encounter: Payer: Self-pay | Admitting: General Surgery

## 2013-03-19 ENCOUNTER — Other Ambulatory Visit: Payer: Self-pay | Admitting: Internal Medicine

## 2013-03-19 VITALS — BP 148/76 | HR 68 | Resp 14 | Ht 69.0 in | Wt 163.0 lb

## 2013-03-19 DIAGNOSIS — L0231 Cutaneous abscess of buttock: Secondary | ICD-10-CM

## 2013-03-19 DIAGNOSIS — L03317 Cellulitis of buttock: Secondary | ICD-10-CM

## 2013-03-19 NOTE — Progress Notes (Addendum)
Patient ID: Breanna Hodge, female   DOB: 1941-06-20, 72 y.o.   MRN: [id number]  Chief Complaint  Patient presents with  . Follow-up    2 week sacral abscess    HPI Breanna Hodge is a 72 y.o. female who presents for a two week follow up of sacral abscess. The patient is doing much better and has no complaints at this time. Culture did grow MRSA. Has been adequately treated with Septra. HPI  Past Medical History  Diagnosis Date  . DVT (deep venous thrombosis)     RLE, associated with pregnancy  . Rash     recurring on RLE, reported as discoid lupus by patient per prior [facility] eval  . Hypertension   . Hyperlipidemia   . History of Bell's palsy   . Clotting disorder     DVT, RLE during pregnancy  . DLE (discoid lupus erythematosus)     diagnosed at [facility] by skin biopsy  . Arthritis   . H/O: Bell's palsy   . Anemia     Past Surgical History  Procedure Laterality Date  . Gyn surgery  1989    hysterectomy  . Cholecystectomy  1987  . Knee arthroscopy  2009    right  . Knee arthroscopy  2007    right knee  . Abdominal hysterectomy  1985    with bladder tack using mesh    Family History  Problem Relation Age of Onset  . Coronary artery disease Mother     CABG  . Cancer Father     leukemia  . Heart disease Brother     "heart problems"  . Lupus Daughter     Social History History  Substance Use Topics  . Smoking status: Former Smoker -- 4.00 packs/day for 6 years    Types: Cigarettes    Quit date: 05/25/1960  . Smokeless tobacco: Never Used     Comment: Quit over 46 years ago  . Alcohol Use: No    Allergies  Allergen Reactions  . Naprosyn (Naproxen) Hives and Shortness Of Breath  . Gluten Meal     Current Outpatient Prescriptions  Medication Sig Dispense Refill  . ALPHA LIPOIC ACID PO Take by mouth.      Marland Kitchen aspirin 81 MG tablet Take 81 mg by mouth daily.        . B Complex Vitamins (VITAMIN B COMPLEX) TABS Take 1 tablet by mouth daily.      .  calcium-vitamin D (OSCAL 500/200 D-3) 500-200 MG-UNIT per tablet Take 2 tablets by mouth daily.       . Cholecalciferol (VITAMIN D3) 1000 UNITS CAPS Take 1 capsule by mouth daily.      . Coenzyme Q10 (COQ-10) 200 MG CAPS 100 mg. Take one by mouth daily       . Glucosamine-Chondroit-Vit C-Mn (GLUCOSAMINE 1500 COMPLEX) CAPS Take one by mouth 2 times daily       . hydrochlorothiazide (HYDRODIURIL) 25 MG tablet TAKE 1 TABLET BY MOUTH EVERY DAY  90 tablet  0  . KRILL OIL PO Take 50 mg's by mouth daily       . lisinopril (PRINIVIL,ZESTRIL) 20 MG tablet Take 2 tablets (40 mg total) by mouth daily.  90 tablet  3  . lisinopril-hydrochlorothiazide (PRINZIDE,ZESTORETIC) 20-12.5 MG per tablet Take 2 tablets by mouth daily.      . Multiple Vitamin (MULTIVITAMIN) tablet Take 1 tablet by mouth daily.      . potassium chloride SA (K-DUR,KLOR-CON) 20 MEQ tablet Take  1 tablet (20 mEq total) by mouth daily.  30 tablet  0  . Probiotic Product (PROBIOTIC FORMULA PO) Take by mouth.      . Red Yeast Rice 600 MG CAPS Take 2 capsules by mouth daily.        Marland Kitchen RESVERATROL 100 MG CAPS Take by mouth.      . sulfamethoxazole-trimethoprim (SEPTRA DS) 800-160 MG per tablet Take 1 tablet by mouth 2 (two) times daily.  20 tablet  0   No current facility-administered medications for this visit.    Review of Systems Review of Systems  Constitutional: Negative.   Respiratory: Negative.   Cardiovascular: Negative.   Gastrointestinal: Negative.     Blood pressure 148/76, pulse 68, resp. rate 14, height 5\' 9"  (1.753 m), weight 163 lb (73.936 kg).  Physical Exam Physical Exam Sacral area shows marked reduction of redness and swelling. Down to about 2 cm size now. No drainage.   Data Reviewed Culture and Sensitivity-MRSA, treated with Septra  Assessment    Responded well to treatment.    Plan        Follow up as needed.        SANKAR,SEEPLAPUTHUR G 03/19/2013, 9:55 AM

## 2013-03-19 NOTE — Patient Instructions (Addendum)
Patient to return as needed. 

## 2013-03-20 NOTE — Telephone Encounter (Signed)
Last OV 8/13 with you and says Dr. Dario Guardian PCP please advise as to refill.

## 2013-03-20 NOTE — Telephone Encounter (Deleted)
No ov since 04/2012 please advise as to refill.

## 2013-04-29 ENCOUNTER — Other Ambulatory Visit: Payer: Self-pay | Admitting: Internal Medicine

## 2013-09-12 ENCOUNTER — Other Ambulatory Visit: Payer: Self-pay | Admitting: Internal Medicine

## 2013-09-12 DIAGNOSIS — E876 Hypokalemia: Secondary | ICD-10-CM

## 2013-09-12 NOTE — Telephone Encounter (Signed)
Last visit 05/09/12

## 2013-09-13 NOTE — Telephone Encounter (Signed)
Denied .  Has not been seen since august 2013,.   Needs labs at the very least prior to refills.  CMET

## 2014-02-06 DIAGNOSIS — H9209 Otalgia, unspecified ear: Secondary | ICD-10-CM | POA: Diagnosis not present

## 2014-02-06 DIAGNOSIS — I10 Essential (primary) hypertension: Secondary | ICD-10-CM | POA: Diagnosis not present

## 2014-02-06 DIAGNOSIS — E876 Hypokalemia: Secondary | ICD-10-CM | POA: Diagnosis not present

## 2014-02-06 DIAGNOSIS — E559 Vitamin D deficiency, unspecified: Secondary | ICD-10-CM | POA: Diagnosis not present

## 2014-02-08 DIAGNOSIS — E559 Vitamin D deficiency, unspecified: Secondary | ICD-10-CM | POA: Diagnosis not present

## 2014-02-08 DIAGNOSIS — R5383 Other fatigue: Secondary | ICD-10-CM | POA: Diagnosis not present

## 2014-02-08 DIAGNOSIS — E785 Hyperlipidemia, unspecified: Secondary | ICD-10-CM | POA: Diagnosis not present

## 2014-02-08 DIAGNOSIS — E876 Hypokalemia: Secondary | ICD-10-CM | POA: Diagnosis not present

## 2014-02-08 DIAGNOSIS — R7989 Other specified abnormal findings of blood chemistry: Secondary | ICD-10-CM | POA: Diagnosis not present

## 2014-02-08 DIAGNOSIS — R5381 Other malaise: Secondary | ICD-10-CM | POA: Diagnosis not present

## 2014-02-08 DIAGNOSIS — I10 Essential (primary) hypertension: Secondary | ICD-10-CM | POA: Diagnosis not present

## 2014-03-04 DIAGNOSIS — R7989 Other specified abnormal findings of blood chemistry: Secondary | ICD-10-CM | POA: Diagnosis not present

## 2014-07-29 ENCOUNTER — Encounter: Payer: Self-pay | Admitting: General Surgery

## 2014-09-11 DIAGNOSIS — Z23 Encounter for immunization: Secondary | ICD-10-CM | POA: Diagnosis not present

## 2014-09-11 DIAGNOSIS — E559 Vitamin D deficiency, unspecified: Secondary | ICD-10-CM | POA: Diagnosis not present

## 2014-09-11 DIAGNOSIS — F339 Major depressive disorder, recurrent, unspecified: Secondary | ICD-10-CM | POA: Diagnosis not present

## 2014-09-11 DIAGNOSIS — I10 Essential (primary) hypertension: Secondary | ICD-10-CM | POA: Diagnosis not present

## 2014-09-11 DIAGNOSIS — E782 Mixed hyperlipidemia: Secondary | ICD-10-CM | POA: Diagnosis not present

## 2014-09-25 DIAGNOSIS — Z0001 Encounter for general adult medical examination with abnormal findings: Secondary | ICD-10-CM | POA: Diagnosis not present

## 2014-09-25 DIAGNOSIS — I10 Essential (primary) hypertension: Secondary | ICD-10-CM | POA: Diagnosis not present

## 2014-09-25 DIAGNOSIS — Z1231 Encounter for screening mammogram for malignant neoplasm of breast: Secondary | ICD-10-CM | POA: Diagnosis not present

## 2014-09-25 DIAGNOSIS — E782 Mixed hyperlipidemia: Secondary | ICD-10-CM | POA: Diagnosis not present

## 2014-10-24 DIAGNOSIS — E782 Mixed hyperlipidemia: Secondary | ICD-10-CM | POA: Diagnosis not present

## 2014-10-24 DIAGNOSIS — I350 Nonrheumatic aortic (valve) stenosis: Secondary | ICD-10-CM | POA: Diagnosis not present

## 2014-10-24 DIAGNOSIS — I10 Essential (primary) hypertension: Secondary | ICD-10-CM | POA: Diagnosis not present

## 2014-10-24 DIAGNOSIS — E559 Vitamin D deficiency, unspecified: Secondary | ICD-10-CM | POA: Diagnosis not present

## 2014-11-01 DIAGNOSIS — M8588 Other specified disorders of bone density and structure, other site: Secondary | ICD-10-CM | POA: Diagnosis not present

## 2014-11-01 DIAGNOSIS — Z13820 Encounter for screening for osteoporosis: Secondary | ICD-10-CM | POA: Diagnosis not present

## 2014-11-01 DIAGNOSIS — M8000XA Age-related osteoporosis with current pathological fracture, unspecified site, initial encounter for fracture: Secondary | ICD-10-CM | POA: Diagnosis not present

## 2014-11-01 DIAGNOSIS — Z780 Asymptomatic menopausal state: Secondary | ICD-10-CM | POA: Diagnosis not present

## 2014-12-26 DIAGNOSIS — R011 Cardiac murmur, unspecified: Secondary | ICD-10-CM | POA: Diagnosis not present

## 2015-01-07 DIAGNOSIS — M2042 Other hammer toe(s) (acquired), left foot: Secondary | ICD-10-CM | POA: Diagnosis not present

## 2015-01-07 DIAGNOSIS — L600 Ingrowing nail: Secondary | ICD-10-CM | POA: Diagnosis not present

## 2015-01-07 DIAGNOSIS — B351 Tinea unguium: Secondary | ICD-10-CM | POA: Diagnosis not present

## 2015-01-07 DIAGNOSIS — M7752 Other enthesopathy of left foot: Secondary | ICD-10-CM | POA: Diagnosis not present

## 2015-02-25 DIAGNOSIS — I10 Essential (primary) hypertension: Secondary | ICD-10-CM | POA: Diagnosis not present

## 2015-05-06 DIAGNOSIS — Z0001 Encounter for general adult medical examination with abnormal findings: Secondary | ICD-10-CM | POA: Diagnosis not present

## 2015-05-06 DIAGNOSIS — I10 Essential (primary) hypertension: Secondary | ICD-10-CM | POA: Diagnosis not present

## 2015-05-06 DIAGNOSIS — H2513 Age-related nuclear cataract, bilateral: Secondary | ICD-10-CM | POA: Diagnosis not present

## 2015-06-25 DIAGNOSIS — H2513 Age-related nuclear cataract, bilateral: Secondary | ICD-10-CM | POA: Diagnosis not present

## 2015-07-04 DIAGNOSIS — H2513 Age-related nuclear cataract, bilateral: Secondary | ICD-10-CM | POA: Diagnosis not present

## 2015-07-07 ENCOUNTER — Encounter: Payer: Self-pay | Admitting: *Deleted

## 2015-07-11 NOTE — Discharge Instructions (Signed)

## 2015-07-14 ENCOUNTER — Ambulatory Visit: Payer: Medicare Other | Admitting: Anesthesiology

## 2015-07-14 ENCOUNTER — Encounter
Admission: RE | Disposition: A | Discharge status: Home / Self Care | Payer: Self-pay | Source: Ambulatory Visit | Attending: Ophthalmology

## 2015-07-14 ENCOUNTER — Ambulatory Visit
Admission: RE | Admit: 2015-07-14 | Discharge: 2015-07-14 | Disposition: A | Discharge status: Home / Self Care | Payer: Medicare Other | Source: Ambulatory Visit | Attending: Ophthalmology | Admitting: Ophthalmology

## 2015-07-14 DIAGNOSIS — H2512 Age-related nuclear cataract, left eye: Secondary | ICD-10-CM | POA: Diagnosis not present

## 2015-07-14 DIAGNOSIS — Y92234 Operating room of hospital as the place of occurrence of the external cause: Secondary | ICD-10-CM | POA: Diagnosis not present

## 2015-07-14 DIAGNOSIS — G9781 Other intraoperative complications of nervous system: Secondary | ICD-10-CM | POA: Insufficient documentation

## 2015-07-14 DIAGNOSIS — Y658 Other specified misadventures during surgical and medical care: Secondary | ICD-10-CM | POA: Diagnosis not present

## 2015-07-14 DIAGNOSIS — H2513 Age-related nuclear cataract, bilateral: Secondary | ICD-10-CM | POA: Diagnosis not present

## 2015-07-14 DIAGNOSIS — Z79899 Other long term (current) drug therapy: Secondary | ICD-10-CM | POA: Insufficient documentation

## 2015-07-14 DIAGNOSIS — G510 Bell's palsy: Secondary | ICD-10-CM | POA: Diagnosis not present

## 2015-07-14 DIAGNOSIS — Z87891 Personal history of nicotine dependence: Secondary | ICD-10-CM | POA: Diagnosis not present

## 2015-07-14 DIAGNOSIS — Z90710 Acquired absence of both cervix and uterus: Secondary | ICD-10-CM | POA: Diagnosis not present

## 2015-07-14 DIAGNOSIS — K9041 Non-celiac gluten sensitivity: Secondary | ICD-10-CM | POA: Diagnosis not present

## 2015-07-14 DIAGNOSIS — Z9049 Acquired absence of other specified parts of digestive tract: Secondary | ICD-10-CM | POA: Diagnosis not present

## 2015-07-14 DIAGNOSIS — I10 Essential (primary) hypertension: Secondary | ICD-10-CM | POA: Insufficient documentation

## 2015-07-14 HISTORY — DX: Presence of dental prosthetic device (complete) (partial): Z97.2

## 2015-07-14 HISTORY — PX: CATARACT EXTRACTION W/PHACO: SHX586

## 2015-07-14 SURGERY — PHACOEMULSIFICATION, CATARACT, WITH IOL INSERTION
Anesthesia: Monitor Anesthesia Care | Laterality: Left | Wound class: Clean

## 2015-07-14 MED ORDER — ARMC OPHTHALMIC DILATING GEL
1.0000 "application " | OPHTHALMIC | Status: DC | PRN
  Administered 2015-07-14 (×2): 1 via OPHTHALMIC

## 2015-07-14 MED ORDER — CARBACHOL 0.01 % IO SOLN
INTRAOCULAR | Status: DC | PRN
  Administered 2015-07-14: 0.5 mL via INTRAOCULAR

## 2015-07-14 MED ORDER — FENTANYL CITRATE (PF) 100 MCG/2ML IJ SOLN
INTRAMUSCULAR | Status: DC | PRN
  Administered 2015-07-14: 50 ug via INTRAVENOUS

## 2015-07-14 MED ORDER — TIMOLOL MALEATE 0.5 % OP SOLN
OPHTHALMIC | Status: DC | PRN
  Administered 2015-07-14: 1 [drp]

## 2015-07-14 MED ORDER — CEFUROXIME OPHTHALMIC INJECTION 1 MG/0.1 ML
INJECTION | OPHTHALMIC | Status: DC | PRN
  Administered 2015-07-14: 0.1 mL via INTRACAMERAL

## 2015-07-14 MED ORDER — ACETAZOLAMIDE 125 MG PO TABS: 250.0000 mg | ORAL_TABLET | Freq: Three times a day (TID) | ORAL | Status: DC

## 2015-07-14 MED ORDER — TETRACAINE HCL 0.5 % OP SOLN
1.0000 [drp] | OPHTHALMIC | Status: DC | PRN
  Administered 2015-07-14: 1 [drp] via OPHTHALMIC

## 2015-07-14 MED ORDER — MIDAZOLAM HCL 2 MG/2ML IJ SOLN
INTRAMUSCULAR | Status: DC | PRN
  Administered 2015-07-14: 2 mg via INTRAVENOUS

## 2015-07-14 MED ORDER — EPINEPHRINE HCL 1 MG/ML IJ SOLN
INTRAOCULAR | Status: DC | PRN
  Administered 2015-07-14: 130 mL via OPHTHALMIC

## 2015-07-14 MED ORDER — POVIDONE-IODINE 5 % OP SOLN
1.0000 "application " | OPHTHALMIC | Status: DC | PRN
  Administered 2015-07-14: 1 via OPHTHALMIC

## 2015-07-14 MED ORDER — BRIMONIDINE TARTRATE 0.2 % OP SOLN
OPHTHALMIC | Status: DC | PRN
  Administered 2015-07-14: 1 [drp]

## 2015-07-14 MED ORDER — NA HYALUR & NA CHOND-NA HYALUR 0.4-0.35 ML IO KIT
PACK | INTRAOCULAR | Status: DC | PRN
  Administered 2015-07-14: 1 mL via INTRAOCULAR

## 2015-07-14 MED ORDER — LIDOCAINE HCL (PF) 4 % IJ SOLN
INTRAOCULAR | Status: DC | PRN
  Administered 2015-07-14: 1 mL via OPHTHALMIC

## 2015-07-14 SURGICAL SUPPLY — 34 items
APL FBRTP 3 NS LF CTTN WD (MISCELLANEOUS) ×1
APPLICATOR COTTON TIP 3IN (MISCELLANEOUS) ×3 IMPLANT
CANNULA ANT/CHMB 27G (MISCELLANEOUS) ×1 IMPLANT
CANNULA ANT/CHMB 27GA (MISCELLANEOUS) ×3 IMPLANT
DISSECTOR HYDRO NUCLEUS 50X22 (MISCELLANEOUS) ×3 IMPLANT
GLOVE BIO SURGEON STRL SZ7 (GLOVE) ×3 IMPLANT
GLOVE SURG LX 6.5 MICRO (GLOVE) ×2
GLOVE SURG LX STRL 6.5 MICRO (GLOVE) ×1 IMPLANT
GOWN STRL REUS W/ TWL LRG LVL3 (GOWN DISPOSABLE) ×2 IMPLANT
GOWN STRL REUS W/TWL LRG LVL3 (GOWN DISPOSABLE) ×6
LENS IOL ACRSF IQ PC 23.0 (Intraocular Lens) IMPLANT
LENS IOL ACRSF MP 27.0 (Intraocular Lens) IMPLANT
LENS IOL ACRYSOF IQ POST 23.0 (Intraocular Lens) IMPLANT
LENS IOL ACRYSOF POST 27.0 (Intraocular Lens) ×3 IMPLANT
MARKER SKIN SURG W/RULER VIO (MISCELLANEOUS) ×3 IMPLANT
NDL FILTER BLUNT 18X1 1/2 (NEEDLE) ×1 IMPLANT
NEEDLE FILTER BLUNT 18X 1/2SAF (NEEDLE) ×4
NEEDLE FILTER BLUNT 18X1 1/2 (NEEDLE) ×2 IMPLANT
PACK CATARACT BRASINGTON (MISCELLANEOUS) ×3 IMPLANT
PACK EYE AFTER SURG (MISCELLANEOUS) ×3 IMPLANT
PACK OPTHALMIC (MISCELLANEOUS) ×3 IMPLANT
RING MALYGIN 7.0 (MISCELLANEOUS) IMPLANT
SOL BAL SALT 15ML (MISCELLANEOUS)
SOLUTION BAL SALT 15ML (MISCELLANEOUS) IMPLANT
SUT ETHILON 10-0 CS-B-6CS-B-6 (SUTURE)
SUT VICRYL  9 0 (SUTURE)
SUT VICRYL 9 0 (SUTURE) IMPLANT
SUTURE EHLN 10-0 CS-B-6CS-B-6 (SUTURE) IMPLANT
SYR 3ML LL SCALE MARK (SYRINGE) ×6 IMPLANT
SYR TB 1ML LUER SLIP (SYRINGE) ×3 IMPLANT
WATER STERILE IRR 250ML POUR (IV SOLUTION) ×3 IMPLANT
WATER STERILE IRR 500ML POUR (IV SOLUTION) IMPLANT
WICK EYE OCUCEL (MISCELLANEOUS) IMPLANT
WIPE NON LINTING 3.25X3.25 (MISCELLANEOUS) ×3 IMPLANT

## 2015-07-14 NOTE — Transfer of Care (Signed)
Immediate Anesthesia Transfer of Care Note  Patient: Breanna Hodge  Procedure(s) Performed: Procedure(s) with comments: CATARACT EXTRACTION PHACO AND INTRAOCULAR LENS PLACEMENT (IOC) (Left) - PER DR OFFICE PT WANTS EARLY AS POSSIBLE  Patient Location: PACU  Anesthesia Type: MAC  Level of Consciousness: awake, alert  and patient cooperative  Airway and Oxygen Therapy: Patient Spontanous Breathing and Patient connected to supplemental oxygen  Post-op Assessment: Post-op Vital signs reviewed, Patient's Cardiovascular Status Stable, Respiratory Function Stable, Patent Airway and No signs of Nausea or vomiting  Post-op Vital Signs: Reviewed and stable  Complications: No apparent anesthesia complications

## 2015-07-14 NOTE — Anesthesia Procedure Notes (Signed)
Procedure Name: MAC Performed by: Kimmi Acocella Pre-anesthesia Checklist: Patient identified, Emergency Drugs available, Suction available, Timeout performed and Patient being monitored Patient Re-evaluated:Patient Re-evaluated prior to inductionOxygen Delivery Method: Nasal cannula Placement Confirmation: positive ETCO2     

## 2015-07-14 NOTE — Anesthesia Preprocedure Evaluation (Signed)
Anesthesia Evaluation  Patient identified by MRN, date of birth, ID band Patient awake    Reviewed: Allergy & Precautions, H&P , NPO status , Patient's Chart, lab work & pertinent test results, reviewed documented beta blocker date and time   Airway Mallampati: II  TM Distance: >3 FB Neck ROM: full    Dental no notable dental hx.    Pulmonary former smoker,    Pulmonary exam normal breath sounds clear to auscultation       Cardiovascular Exercise Tolerance: Good hypertension,  Rhythm:regular Rate:Normal     Neuro/Psych negative neurological ROS  negative psych ROS   GI/Hepatic negative GI ROS, Neg liver ROS,   Endo/Other  negative endocrine ROS  Renal/GU negative Renal ROS  negative genitourinary   Musculoskeletal   Abdominal   Peds  Hematology  (+) anemia ,   Anesthesia Other Findings   Reproductive/Obstetrics negative OB ROS                             Anesthesia Physical Anesthesia Plan  ASA: II  Anesthesia Plan: MAC   Post-op Pain Management:    Induction:   Airway Management Planned:   Additional Equipment:   Intra-op Plan:   Post-operative Plan:   Informed Consent: I have reviewed the patients History and Physical, chart, labs and discussed the procedure including the risks, benefits and alternatives for the proposed anesthesia with the patient or authorized representative who has indicated his/her understanding and acceptance.     Plan Discussed with: CRNA  Anesthesia Plan Comments:         Anesthesia Quick Evaluation

## 2015-07-14 NOTE — Op Note (Signed)
Date of Surgery: 07/14/2015  PREOPERATIVE DIAGNOSES: Visually significant nuclear sclerotic cataract, left eye.  POSTOPERATIVE DIAGNOSES: Same  PROCEDURES PERFORMED: Cataract extraction with sulcus intraocular lens implant, left eye.  SURGEON: Almon Hercules, M.D.  ANESTHESIA: MAC and topical  IMPLANTS: MA60AC sulcus lens +22.5  Implant Name Type Inv. Item Serial No. Manufacturer Lot No. LRB No. Used  IMPLANT LENS - [id number] Intraocular Lens IMPLANT LENS [id number] ALCON  Left 1  MA60AC ACRSOF INTRAOCULAR LENS Intraocular Lens   [id number] ALCON   Left 1    COMPLICATIONS: Posterior capsular break.  DESCRIPTION OF PROCEDURE: Therapeutic options were discussed with the patient preoperatively, including a discussion of risks and benefits of surgery. Informed consent was obtained. An IOL-Master and immersion biometry were used to take the lens measurements, and a dilated fundus exam was performed within 6 months of the surgical date.  The patient was premedicated and brought to the operating room and placed on the operating table in the supine position. After adequate anesthesia, the patient was prepped and draped in the usual sterile ophthalmic fashion. A wire lid speculum was inserted and the microscope was positioned. A Superblade was used to create a paracentesis site at the limbus and a small amount of dilute preservative free lidocaine was instilled into the anterior chamber, followed by dispersive viscoelastic. A clear corneal incision was created temporally using a 2.4 mm keratome blade. Capsulorrhexis was then performed. In situ phacoemulsification was performed.  Cortical material was removed with the irrigation-aspiration unit. Dispersive viscoelastic was instilled to open the capsular bag - at this point a posterior capsular rent was noted. A 3 piece posterior chamber intraocular lens with the specifications above was inserted and positioned in the sulcus. Irrigation-aspiration  was used to remove all viscoelastic. Cefuroxime 1cc was instilled into the anterior chamber, followed by Miochol.  The corneal incision was sutured with 10-0 nylon suture, and was found to be water tight. At no time was there any vitreous tracking to the incision sites.  The pupil remained round at the end of the case.  The eyelid speculum was removed.  The operative eye was covered with protective goggles after instilling 1 drop of timolol and brimonidine. The patient tolerated the procedure well. There were no complications.

## 2015-07-14 NOTE — Anesthesia Postprocedure Evaluation (Signed)
  Anesthesia Post-op Note  Patient: International aid/development worker  Procedure(s) Performed: Procedure(s) with comments: CATARACT EXTRACTION PHACO AND INTRAOCULAR LENS PLACEMENT (IOC) (Left) - PER DR OFFICE PT WANTS EARLY AS POSSIBLE  Anesthesia type:MAC  Patient location: PACU  Post pain: Pain level controlled  Post assessment: Post-op Vital signs reviewed, Patient's Cardiovascular Status Stable, Respiratory Function Stable, Patent Airway and No signs of Nausea or vomiting  Post vital signs: Reviewed and stable  Last Vitals:  Filed Vitals:   07/14/15 1037  BP: 138/68  Pulse: 77  Temp: 36.2 C  Resp: 16    Level of consciousness: awake, alert  and patient cooperative  Complications: No apparent anesthesia complications

## 2015-07-14 NOTE — H&P (Signed)
H+P reviewed and is up to date, please see paper chart.  

## 2015-07-15 ENCOUNTER — Encounter: Payer: Self-pay | Admitting: Ophthalmology

## 2015-08-12 DIAGNOSIS — H2511 Age-related nuclear cataract, right eye: Secondary | ICD-10-CM | POA: Diagnosis not present

## 2015-08-13 ENCOUNTER — Encounter: Payer: Self-pay | Admitting: *Deleted

## 2015-08-19 NOTE — Discharge Instructions (Signed)

## 2015-08-25 ENCOUNTER — Encounter
Admission: RE | Disposition: A | Discharge status: Home / Self Care | Payer: Self-pay | Source: Ambulatory Visit | Attending: Ophthalmology

## 2015-08-25 ENCOUNTER — Ambulatory Visit: Payer: Medicare Other | Admitting: Anesthesiology

## 2015-08-25 ENCOUNTER — Ambulatory Visit
Admission: RE | Admit: 2015-08-25 | Discharge: 2015-08-25 | Disposition: A | Discharge status: Home / Self Care | Payer: Medicare Other | Source: Ambulatory Visit | Attending: Ophthalmology | Admitting: Ophthalmology

## 2015-08-25 DIAGNOSIS — I10 Essential (primary) hypertension: Secondary | ICD-10-CM | POA: Insufficient documentation

## 2015-08-25 DIAGNOSIS — Z888 Allergy status to other drugs, medicaments and biological substances status: Secondary | ICD-10-CM | POA: Diagnosis not present

## 2015-08-25 DIAGNOSIS — Z86718 Personal history of other venous thrombosis and embolism: Secondary | ICD-10-CM | POA: Diagnosis not present

## 2015-08-25 DIAGNOSIS — M1990 Unspecified osteoarthritis, unspecified site: Secondary | ICD-10-CM | POA: Diagnosis not present

## 2015-08-25 DIAGNOSIS — H2511 Age-related nuclear cataract, right eye: Secondary | ICD-10-CM | POA: Diagnosis not present

## 2015-08-25 DIAGNOSIS — Z87891 Personal history of nicotine dependence: Secondary | ICD-10-CM | POA: Insufficient documentation

## 2015-08-25 DIAGNOSIS — D649 Anemia, unspecified: Secondary | ICD-10-CM | POA: Diagnosis not present

## 2015-08-25 DIAGNOSIS — Z9842 Cataract extraction status, left eye: Secondary | ICD-10-CM | POA: Insufficient documentation

## 2015-08-25 DIAGNOSIS — Z90710 Acquired absence of both cervix and uterus: Secondary | ICD-10-CM | POA: Diagnosis not present

## 2015-08-25 DIAGNOSIS — Z9049 Acquired absence of other specified parts of digestive tract: Secondary | ICD-10-CM | POA: Insufficient documentation

## 2015-08-25 DIAGNOSIS — G510 Bell's palsy: Secondary | ICD-10-CM | POA: Diagnosis not present

## 2015-08-25 DIAGNOSIS — K9041 Non-celiac gluten sensitivity: Secondary | ICD-10-CM | POA: Insufficient documentation

## 2015-08-25 HISTORY — PX: CATARACT EXTRACTION W/PHACO: SHX586

## 2015-08-25 SURGERY — PHACOEMULSIFICATION, CATARACT, WITH IOL INSERTION
Anesthesia: Monitor Anesthesia Care | Site: Eye | Laterality: Right | Wound class: Clean

## 2015-08-25 MED ORDER — TIMOLOL MALEATE 0.5 % OP SOLN
OPHTHALMIC | Status: DC | PRN
  Administered 2015-08-25: 1 [drp] via OPHTHALMIC

## 2015-08-25 MED ORDER — BRIMONIDINE TARTRATE 0.2 % OP SOLN
OPHTHALMIC | Status: DC | PRN
  Administered 2015-08-25: 1 [drp] via OPHTHALMIC

## 2015-08-25 MED ORDER — MIDAZOLAM HCL 2 MG/2ML IJ SOLN
INTRAMUSCULAR | Status: DC | PRN
  Administered 2015-08-25: 2 mg via INTRAVENOUS

## 2015-08-25 MED ORDER — POVIDONE-IODINE 5 % OP SOLN
1.0000 "application " | OPHTHALMIC | Status: DC | PRN
  Administered 2015-08-25: 1 via OPHTHALMIC

## 2015-08-25 MED ORDER — LACTATED RINGERS IV SOLN: INTRAVENOUS | Status: DC

## 2015-08-25 MED ORDER — EPINEPHRINE HCL 1 MG/ML IJ SOLN
INTRAOCULAR | Status: DC | PRN
  Administered 2015-08-25: 11:00:00 via OPHTHALMIC
  Administered 2015-08-25: 122 mL via OPHTHALMIC

## 2015-08-25 MED ORDER — CEFUROXIME OPHTHALMIC INJECTION 1 MG/0.1 ML
INJECTION | OPHTHALMIC | Status: DC | PRN
  Administered 2015-08-25: .2 mL via INTRACAMERAL

## 2015-08-25 MED ORDER — LIDOCAINE HCL (PF) 4 % IJ SOLN
INTRAOCULAR | Status: DC | PRN
  Administered 2015-08-25: 1 mL via OPHTHALMIC

## 2015-08-25 MED ORDER — TETRACAINE HCL 0.5 % OP SOLN
1.0000 [drp] | OPHTHALMIC | Status: DC | PRN
  Administered 2015-08-25: 1 [drp] via OPHTHALMIC

## 2015-08-25 MED ORDER — OXYCODONE HCL 5 MG/5ML PO SOLN: 5.0000 mg | Freq: Once | ORAL | Status: DC | PRN

## 2015-08-25 MED ORDER — FENTANYL CITRATE (PF) 100 MCG/2ML IJ SOLN
INTRAMUSCULAR | Status: DC | PRN
  Administered 2015-08-25: 100 ug via INTRAVENOUS

## 2015-08-25 MED ORDER — OXYCODONE HCL 5 MG PO TABS: 5.0000 mg | ORAL_TABLET | Freq: Once | ORAL | Status: DC | PRN

## 2015-08-25 MED ORDER — NA HYALUR & NA CHOND-NA HYALUR 0.4-0.35 ML IO KIT
PACK | INTRAOCULAR | Status: DC | PRN
  Administered 2015-08-25: 1 mL via INTRAOCULAR

## 2015-08-25 MED ORDER — ARMC OPHTHALMIC DILATING GEL
1.0000 "application " | OPHTHALMIC | Status: DC | PRN
  Administered 2015-08-25 (×2): 1 via OPHTHALMIC

## 2015-08-25 SURGICAL SUPPLY — 32 items
APL FBRTP 3 NS LF CTTN WD (MISCELLANEOUS) ×1
APPLICATOR COTTON TIP 3IN (MISCELLANEOUS) ×3 IMPLANT
CANNULA ANT/CHMB 27G (MISCELLANEOUS) ×1 IMPLANT
CANNULA ANT/CHMB 27GA (MISCELLANEOUS) ×3 IMPLANT
DISSECTOR HYDRO NUCLEUS 50X22 (MISCELLANEOUS) ×3 IMPLANT
GLOVE BIO SURGEON STRL SZ7 (GLOVE) ×3 IMPLANT
GLOVE SURG LX 6.5 MICRO (GLOVE) ×2
GLOVE SURG LX STRL 6.5 MICRO (GLOVE) ×1 IMPLANT
GOWN STRL REUS W/ TWL LRG LVL3 (GOWN DISPOSABLE) ×2 IMPLANT
GOWN STRL REUS W/TWL LRG LVL3 (GOWN DISPOSABLE) ×6
LENS IOL ACRSF IQ PC 23.5 (Intraocular Lens) IMPLANT
LENS IOL ACRYSOF IQ POST 23.5 (Intraocular Lens) ×3 IMPLANT
MARKER SKIN SURG W/RULER VIO (MISCELLANEOUS) ×3 IMPLANT
NDL FILTER BLUNT 18X1 1/2 (NEEDLE) ×1 IMPLANT
NEEDLE FILTER BLUNT 18X 1/2SAF (NEEDLE) ×2
NEEDLE FILTER BLUNT 18X1 1/2 (NEEDLE) ×1 IMPLANT
PACK CATARACT BRASINGTON (MISCELLANEOUS) ×3 IMPLANT
PACK EYE AFTER SURG (MISCELLANEOUS) ×3 IMPLANT
PACK OPTHALMIC (MISCELLANEOUS) ×3 IMPLANT
RING MALYGIN 7.0 (MISCELLANEOUS) IMPLANT
SOL BAL SALT 15ML (MISCELLANEOUS)
SOLUTION BAL SALT 15ML (MISCELLANEOUS) IMPLANT
SUT ETHILON 10-0 CS-B-6CS-B-6 (SUTURE)
SUT VICRYL  9 0 (SUTURE)
SUT VICRYL 9 0 (SUTURE) IMPLANT
SUTURE EHLN 10-0 CS-B-6CS-B-6 (SUTURE) IMPLANT
SYR 3ML LL SCALE MARK (SYRINGE) ×3 IMPLANT
SYR TB 1ML LUER SLIP (SYRINGE) ×3 IMPLANT
WATER STERILE IRR 250ML POUR (IV SOLUTION) ×3 IMPLANT
WATER STERILE IRR 500ML POUR (IV SOLUTION) IMPLANT
WICK EYE OCUCEL (MISCELLANEOUS) IMPLANT
WIPE NON LINTING 3.25X3.25 (MISCELLANEOUS) ×3 IMPLANT

## 2015-08-25 NOTE — Anesthesia Postprocedure Evaluation (Signed)
Anesthesia Post Note  Patient: Breanna Hodge  Procedure(s) Performed: Procedure(s) (LRB): CATARACT EXTRACTION PHACO AND INTRAOCULAR LENS PLACEMENT (IOC) (Right)  Patient location during evaluation: PACU Anesthesia Type: MAC Level of consciousness: awake and alert Pain management: pain level controlled Vital Signs Assessment: post-procedure vital signs reviewed and stable Respiratory status: spontaneous breathing, nonlabored ventilation, respiratory function stable and patient connected to nasal cannula oxygen Cardiovascular status: stable and blood pressure returned to baseline Anesthetic complications: no    Yaneli Keithley

## 2015-08-25 NOTE — H&P (Signed)
H+P reviewed and is up to date, please see paper chart.  

## 2015-08-25 NOTE — Op Note (Signed)
Date of Surgery: 08/25/2015  PREOPERATIVE DIAGNOSES: Visually significant nuclear sclerotic cataract, right eye.  POSTOPERATIVE DIAGNOSES: Same  PROCEDURES PERFORMED: Cataract extraction with intraocular lens implant, right eye.  SURGEON: Almon Hercules, M.D.  ANESTHESIA: MAC and topical  IMPLANTS: AcrySof IQ [id number] +23.5 D   Implant Name Type Inv. Item Serial No. Manufacturer Lot No. LRB No. Used  IMPLANT LENS - [id number] Intraocular Lens IMPLANT LENS [id number] ALCON   Right 1     COMPLICATIONS: None.  DESCRIPTION OF PROCEDURE: Therapeutic options were discussed with the patient preoperatively, including a discussion of risks and benefits of surgery. Informed consent was obtained. An IOL-Master and immersion biometry were used to take the lens measurements, and a dilated fundus exam was performed within 6 months of the surgical date.  The patient was premedicated and brought to the operating room and placed on the operating table in the supine position. After adequate anesthesia, the patient was prepped and draped in the usual sterile ophthalmic fashion. A wire lid speculum was inserted and the microscope was positioned. A Superblade was used to create a paracentesis site at the limbus and a small amount of dilute preservative free lidocaine was instilled into the anterior chamber, followed by dispersive viscoelastic. A clear corneal incision was created temporally using a 2.4 mm keratome blade. Capsulorrhexis was then performed. In situ phacoemulsification was performed.  Cortical material was removed with the irrigation-aspiration unit. Dispersive viscoelastic was instilled to open the capsular bag. A posterior chamber intraocular lens with the specifications above was inserted and positioned. Irrigation-aspiration was used to remove all viscoelastic. Cefuroxime 1cc was instilled into the anterior chamber, and the corneal incision was checked and found to be water tight. The eyelid  speculum was removed.  The operative eye was covered with protective goggles after instilling 1 drop of timolol and brimonidine. The patient tolerated the procedure well. There were no complications.

## 2015-08-25 NOTE — Transfer of Care (Signed)
Immediate Anesthesia Transfer of Care Note  Patient: Breanna Hodge  Procedure(s) Performed: Procedure(s) with comments: CATARACT EXTRACTION PHACO AND INTRAOCULAR LENS PLACEMENT (IOC) (Right) - PER DR OFFICE PT WANTS AS EARLY AS POSSIBLE  Patient Location: PACU  Anesthesia Type: MAC  Level of Consciousness: awake, alert  and patient cooperative  Airway and Oxygen Therapy: Patient Spontanous Breathing and Patient connected to supplemental oxygen  Post-op Assessment: Post-op Vital signs reviewed, Patient's Cardiovascular Status Stable, Respiratory Function Stable, Patent Airway and No signs of Nausea or vomiting  Post-op Vital Signs: Reviewed and stable  Complications: No apparent anesthesia complications

## 2015-08-25 NOTE — Anesthesia Preprocedure Evaluation (Signed)
Anesthesia Evaluation  Patient identified by MRN, date of birth, ID band Patient awake    Reviewed: Allergy & Precautions, H&P , NPO status , Patient's Chart, lab work & pertinent test results, reviewed documented beta blocker date and time   History of Anesthesia Complications Negative for: history of anesthetic complications  Airway Mallampati: II  TM Distance: >3 FB Neck ROM: full    Dental  (+) Upper Dentures, Lower Dentures   Pulmonary neg pulmonary ROS, former smoker,    Pulmonary exam normal breath sounds clear to auscultation       Cardiovascular Exercise Tolerance: Good hypertension, + DVT  Normal cardiovascular exam Rhythm:regular Rate:Normal     Neuro/Psych L bell's palsy  negative neurological ROS  negative psych ROS   GI/Hepatic negative GI ROS, Neg liver ROS,   Endo/Other  negative endocrine ROS  Renal/GU negative Renal ROS  negative genitourinary   Musculoskeletal  (+) Arthritis , Skin lupus?    Abdominal   Peds  Hematology  (+) anemia ,   Anesthesia Other Findings   Reproductive/Obstetrics negative OB ROS                             Anesthesia Physical Anesthesia Plan  ASA: II  Anesthesia Plan: MAC   Post-op Pain Management:    Induction:   Airway Management Planned:   Additional Equipment:   Intra-op Plan:   Post-operative Plan:   Informed Consent:   Plan Discussed with:   Anesthesia Plan Comments:         Anesthesia Quick Evaluation

## 2015-08-26 ENCOUNTER — Encounter: Payer: Self-pay | Admitting: Ophthalmology

## 2015-09-03 DIAGNOSIS — Z23 Encounter for immunization: Secondary | ICD-10-CM | POA: Diagnosis not present

## 2015-12-16 DIAGNOSIS — G510 Bell's palsy: Secondary | ICD-10-CM | POA: Diagnosis not present

## 2015-12-16 DIAGNOSIS — I517 Cardiomegaly: Secondary | ICD-10-CM | POA: Diagnosis not present

## 2015-12-16 DIAGNOSIS — H2513 Age-related nuclear cataract, bilateral: Secondary | ICD-10-CM | POA: Diagnosis not present

## 2015-12-16 DIAGNOSIS — I10 Essential (primary) hypertension: Secondary | ICD-10-CM | POA: Diagnosis not present

## 2015-12-19 DIAGNOSIS — G510 Bell's palsy: Secondary | ICD-10-CM | POA: Diagnosis not present

## 2016-03-25 DIAGNOSIS — B880 Other acariasis: Secondary | ICD-10-CM | POA: Diagnosis not present

## 2016-04-26 DIAGNOSIS — R7989 Other specified abnormal findings of blood chemistry: Secondary | ICD-10-CM | POA: Diagnosis not present

## 2016-04-26 DIAGNOSIS — I10 Essential (primary) hypertension: Secondary | ICD-10-CM | POA: Diagnosis not present

## 2016-04-26 DIAGNOSIS — Z79899 Other long term (current) drug therapy: Secondary | ICD-10-CM | POA: Diagnosis not present

## 2016-05-19 DIAGNOSIS — E559 Vitamin D deficiency, unspecified: Secondary | ICD-10-CM | POA: Diagnosis not present

## 2016-05-19 DIAGNOSIS — E782 Mixed hyperlipidemia: Secondary | ICD-10-CM | POA: Diagnosis not present

## 2016-05-19 DIAGNOSIS — I10 Essential (primary) hypertension: Secondary | ICD-10-CM | POA: Diagnosis not present

## 2016-05-19 DIAGNOSIS — Z0001 Encounter for general adult medical examination with abnormal findings: Secondary | ICD-10-CM | POA: Diagnosis not present

## 2016-05-26 DIAGNOSIS — Z1231 Encounter for screening mammogram for malignant neoplasm of breast: Secondary | ICD-10-CM | POA: Diagnosis not present

## 2016-07-06 DIAGNOSIS — Z23 Encounter for immunization: Secondary | ICD-10-CM | POA: Diagnosis not present

## 2016-09-07 DIAGNOSIS — J302 Other seasonal allergic rhinitis: Secondary | ICD-10-CM | POA: Diagnosis not present

## 2016-09-07 DIAGNOSIS — R05 Cough: Secondary | ICD-10-CM | POA: Diagnosis not present

## 2016-09-07 DIAGNOSIS — D824 Hyperimmunoglobulin E [IgE] syndrome: Secondary | ICD-10-CM | POA: Diagnosis not present

## 2016-09-07 DIAGNOSIS — K900 Celiac disease: Secondary | ICD-10-CM | POA: Diagnosis not present

## 2016-09-07 DIAGNOSIS — J4520 Mild intermittent asthma, uncomplicated: Secondary | ICD-10-CM | POA: Diagnosis not present

## 2016-09-07 DIAGNOSIS — Z91011 Allergy to milk products: Secondary | ICD-10-CM | POA: Diagnosis not present

## 2016-09-07 DIAGNOSIS — Z91018 Allergy to other foods: Secondary | ICD-10-CM | POA: Diagnosis not present

## 2016-09-07 DIAGNOSIS — J309 Allergic rhinitis, unspecified: Secondary | ICD-10-CM | POA: Diagnosis not present

## 2017-02-16 DIAGNOSIS — I10 Essential (primary) hypertension: Secondary | ICD-10-CM | POA: Diagnosis not present

## 2017-02-25 DIAGNOSIS — I10 Essential (primary) hypertension: Secondary | ICD-10-CM | POA: Diagnosis not present

## 2017-03-07 DIAGNOSIS — I10 Essential (primary) hypertension: Secondary | ICD-10-CM | POA: Diagnosis not present

## 2017-03-07 DIAGNOSIS — G4485 Primary stabbing headache: Secondary | ICD-10-CM | POA: Diagnosis not present

## 2017-07-11 DIAGNOSIS — Z0000 Encounter for general adult medical examination without abnormal findings: Secondary | ICD-10-CM | POA: Diagnosis not present

## 2017-07-11 DIAGNOSIS — I10 Essential (primary) hypertension: Secondary | ICD-10-CM | POA: Diagnosis not present

## 2017-07-11 DIAGNOSIS — Z23 Encounter for immunization: Secondary | ICD-10-CM | POA: Diagnosis not present

## 2017-09-16 DIAGNOSIS — I10 Essential (primary) hypertension: Secondary | ICD-10-CM | POA: Diagnosis not present

## 2017-09-16 DIAGNOSIS — E782 Mixed hyperlipidemia: Secondary | ICD-10-CM | POA: Diagnosis not present

## 2017-09-22 DIAGNOSIS — N951 Menopausal and female climacteric states: Secondary | ICD-10-CM | POA: Diagnosis not present

## 2017-09-22 DIAGNOSIS — I10 Essential (primary) hypertension: Secondary | ICD-10-CM | POA: Diagnosis not present

## 2017-09-22 DIAGNOSIS — E782 Mixed hyperlipidemia: Secondary | ICD-10-CM | POA: Diagnosis not present

## 2017-09-27 HISTORY — PX: OTHER SURGICAL HISTORY: SHX169

## 2018-05-15 DIAGNOSIS — I70213 Atherosclerosis of native arteries of extremities with intermittent claudication, bilateral legs: Secondary | ICD-10-CM | POA: Insufficient documentation

## 2018-05-15 DIAGNOSIS — I83813 Varicose veins of bilateral lower extremities with pain: Secondary | ICD-10-CM | POA: Insufficient documentation

## 2019-10-02 ENCOUNTER — Encounter: Payer: Self-pay | Admitting: Internal Medicine

## 2019-11-06 ENCOUNTER — Other Ambulatory Visit: Payer: Self-pay

## 2019-11-06 ENCOUNTER — Encounter: Payer: Self-pay | Admitting: Cardiovascular Disease

## 2019-11-06 ENCOUNTER — Ambulatory Visit (INDEPENDENT_AMBULATORY_CARE_PROVIDER_SITE_OTHER): Payer: Medicare Other | Admitting: Cardiovascular Disease

## 2019-11-06 VITALS — BP 158/68 | HR 75 | Ht 69.0 in | Wt 150.0 lb

## 2019-11-06 DIAGNOSIS — I10 Essential (primary) hypertension: Secondary | ICD-10-CM

## 2019-11-06 DIAGNOSIS — I447 Left bundle-branch block, unspecified: Secondary | ICD-10-CM

## 2019-11-06 DIAGNOSIS — R9431 Abnormal electrocardiogram [ECG] [EKG]: Secondary | ICD-10-CM

## 2019-11-06 DIAGNOSIS — R0602 Shortness of breath: Secondary | ICD-10-CM | POA: Diagnosis not present

## 2019-11-06 DIAGNOSIS — R079 Chest pain, unspecified: Secondary | ICD-10-CM

## 2019-11-06 NOTE — Patient Instructions (Addendum)
Please check blood pressures at home Call with numbers. Please read information below on Blood pressure monitoring. Make sure to check blood pressures 2 hours after medications.   Medication Instructions:  No changes  If you need a refill on your cardiac medications before your next appointment, please call your pharmacy.    Lab work: No new labs needed   If you have labs (blood work) drawn today and your tests are completely normal, you will receive your results only by: Marland Kitchen MyChart Message (if you have MyChart) OR . A paper copy in the mail If you have any lab test that is abnormal or we need to change your treatment, we will call you to review the results.   Testing/Procedures: We will order a lexiscan myoview on Thursday/Friday  [place]  Your caregiver has ordered a Stress Test with nuclear imaging. The purpose of this test is to evaluate the blood supply to your heart muscle. This procedure is referred to as a "Non-Invasive Stress Test." This is because other than having an IV started in your vein, nothing is inserted or "invades" your body. Cardiac stress tests are done to find areas of poor blood flow to the heart by determining the extent of coronary artery disease (CAD). Some patients exercise on a treadmill, which naturally increases the blood flow to your heart, while others who are  unable to walk on a treadmill due to physical limitations have a pharmacologic/chemical stress agent called Lexiscan . This medicine will mimic walking on a treadmill by temporarily increasing your coronary blood flow.   Please note: these test may take anywhere between 2-4 hours to complete  PLEASE REPORT TO [place] AT THE FIRST DESK WILL DIRECT YOU WHERE TO GO  Date of Procedure:_____________________________________  Arrival Time for Procedure:______________________________   PLEASE NOTIFY THE OFFICE AT LEAST 24 HOURS IN ADVANCE IF YOU ARE UNABLE TO  KEEP YOUR APPOINTMENT.  [phone] AND  PLEASE NOTIFY NUCLEAR MEDICINE AT [facility] AT LEAST 24 HOURS IN ADVANCE IF YOU ARE UNABLE TO KEEP YOUR APPOINTMENT. [phone]  How to prepare for your Myoview test:  1. Do not eat or drink after midnight 2. No caffeine for 24 hours prior to test 3. No smoking 24 hours prior to test. 4. Your medication may be taken with water.  If your doctor stopped a medication because of this test, do not take that medication. 5. Ladies, please do not wear dresses.  Skirts or pants are appropriate. Please wear a short sleeve shirt. 6. No perfume, cologne or lotion. 7. Wear comfortable walking shoes. No heels!    Follow-Up: At [facility], you and your health needs are our priority.  As part of our continuing mission to provide you with exceptional heart care, we have created designated Provider Care Teams.  These Care Teams include your primary Cardiologist (physician) and Advanced Practice Providers (APPs -  Physician Assistants and Nurse Practitioners) who all work together to provide you with the care you need, when you need it.  . You will need a follow up appointment in 12 months   . Providers on your designated Care Team:   . Murray Hodgkins, NP . Christell Faith, PA-C . Marrianne Mood, PA-C  Any Other Special Instructions Will Be Listed Below (If Applicable).  For educational health videos Log in to : [email or web address] Or : [email or web address], password : [email or web address]   How to Take Your Blood Pressure You can take your blood pressure at home  with a machine. You may need to check your blood pressure at home:  To check if you have high blood pressure (hypertension).  To check your blood pressure over time.  To make sure your blood pressure medicine is working. Supplies needed: You will need a blood pressure machine, or monitor. You can buy one at a drugstore or online. When choosing one:  Choose one with an arm cuff.  Choose one that wraps around your  upper arm. Only one finger should fit between your arm and the cuff.  Do not choose one that measures your blood pressure from your wrist or finger. Your doctor can suggest a monitor. How to prepare Avoid these things for 30 minutes before checking your blood pressure:  Drinking caffeine.  Drinking alcohol.  Eating.  Smoking.  Exercising. Five minutes before checking your blood pressure:  Pee.  Sit in a dining chair. Avoid sitting in a soft couch or armchair.  Be quiet. Do not talk. How to take your blood pressure Follow the instructions that came with your machine. If you have a digital blood pressure monitor, these may be the instructions: 1. Sit up straight. 2. Place your feet on the floor. Do not cross your ankles or legs. 3. Rest your left arm at the level of your heart. You may rest it on a table, desk, or chair. 4. Pull up your shirt sleeve. 5. Wrap the blood pressure cuff around the upper part of your left arm. The cuff should be 1 inch (2.5 cm) above your elbow. It is best to wrap the cuff around bare skin. 6. Fit the cuff snugly around your arm. You should be able to place only one finger between the cuff and your arm. 7. Put the cord inside the groove of your elbow. 8. Press the power button. 9. Sit quietly while the cuff fills with air and loses air. 10. Write down the numbers on the screen. 11. Wait 2-3 minutes and then repeat steps 1-10. What do the numbers mean? Two numbers make up your blood pressure. The first number is called systolic pressure. The second is called diastolic pressure. An example of a blood pressure reading is "120 over 80" (or 120/80). If you are an adult and do not have a medical condition, use this guide to find out if your blood pressure is normal: Normal  First number: below 120.  Second number: below 80. Elevated  First number: 120-129.  Second number: below 80. Hypertension stage 1  First number: 130-139.  Second number:  80-89. Hypertension stage 2  First number: 140 or above.  Second number: 45 or above. Your blood pressure is above normal even if only the top or bottom number is above normal. Follow these instructions at home:  Check your blood pressure as often as your doctor tells you to.  Take your monitor to your next doctor's appointment. Your doctor will: ? Make sure you are using it correctly. ? Make sure it is working right.  Make sure you understand what your blood pressure numbers should be.  Tell your doctor if your medicines are causing side effects. Contact a doctor if:  Your blood pressure keeps being high. Get help right away if:  Your first blood pressure number is higher than 180.  Your second blood pressure number is higher than 120. This information is not intended to replace advice given to you by your health care provider. Make sure you discuss any questions you have with your health  care provider. Document Revised: 08/26/2017 Document Reviewed: 02/20/2016 Elsevier Patient Education  2020 [employer].

## 2019-11-06 NOTE — Progress Notes (Signed)
Cardiology Office Note  Date:  11/06/2019   ID:  Breanna Hodge, DOB 1941-03-10, MRN [id number]  PCP:  Breanna Carls, MD   Chief Complaint  Patient presents with  . New Patient (Initial Visit)    Establish care for a history of abnormal EKG's; patient recently moved back to [place] from [place]. She was followed by a Cardiologist in [place]. Pt. c/o shortness of breath when walking her dog up a hill.     HPI:  [name] often Breanna Hodge is a 79 year old woman with past medical history of hyperlipidemia 4 yr of smoking, remote Left bundle branch block Vascular issues/varicose veins status post RLE GSV RFA on 07/18/2018.  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  EKG in care everywhere: no mention of LBBB  Stress test 03/2018 IMPRESSION 1. ABNORMAL STUDY. 2. SMALL ZONE OF MILD APICAL ISCHEMIA. 3. THIS STUDY IS REPORTED AT [facility] WITHOUT PATIENT  SYMPTOMS  OF CHEST PAIN, AND WITH NEGATIVE ST SEGMENT RESPONSE.  GATED STUDY 1. LEFT VENTRICULAR GATED STUDY DEMONSTRATES NORMAL LEFT VENTRICULAR  FUNCTION. 2. ESTIMATED LEFT VENTRICULAR EJECTION FRACTION IS 48%. 3. LEFT VENTRICULAR WALL MOTION IS NORMAL.  Walks dog in AM,  Legs hurt, Gets tired, SOB Had no sx 5 years ago, but was not active Has been walking hill for one year, still with sx  EKG personally reviewed by myself on todays visit Shows NSR LBBB rate 75 bpm    Total chol 277, LDL 181 in 06/2019 LDL 189   PMH:   has a past medical history of Anemia, Arthritis, Clotting disorder (HCC), DLE (discoid lupus erythematosus), DVT (deep venous thrombosis) ([place]), H/O: Bell's palsy, Hyperlipidemia, Hypertension, Rash, and Wears dentures.  PSH:    Past Surgical History:   Procedure Laterality Date  . ABDOMINAL HYSTERECTOMY  1985   with bladder tack using mesh  . CATARACT EXTRACTION W/PHACO Left 07/14/2015   Procedure: CATARACT EXTRACTION PHACO AND INTRAOCULAR LENS PLACEMENT (IOC);  Surgeon: Breanna Freshwater, MD;  Location: [facility];  Service: Ophthalmology;  Laterality: Left;  PER DR OFFICE PT WANTS EARLY AS POSSIBLE  . CATARACT EXTRACTION W/PHACO Right 08/25/2015   Procedure: CATARACT EXTRACTION PHACO AND INTRAOCULAR LENS PLACEMENT (IOC);  Surgeon: Breanna Freshwater, MD;  Location: [place];  Service: Ophthalmology;  Laterality: Right;  PER DR OFFICE PT WANTS AS EARLY AS POSSIBLE  . CHOLECYSTECTOMY  1987  . gyn surgery  1989   hysterectomy  . KNEE ARTHROSCOPY  2009   right  . KNEE ARTHROSCOPY  2007   right knee    Current Outpatient Medications  Medication Sig Dispense Refill  . atorvastatin (LIPITOR) 10 MG tablet Take 10 mg by mouth daily.    . B Complex Vitamins (VITAMIN B COMPLEX) TABS Take 1 tablet by mouth daily.    . calcium-vitamin D (OSCAL 500/200 D-3) 500-200 MG-UNIT per tablet Take 2 tablets by mouth daily.     . Cholecalciferol (VITAMIN D3) 1000 UNITS CAPS Take 1 capsule by mouth daily.    . Coenzyme Q10 (COQ-10) 200 MG CAPS 100 mg. Take one by mouth daily     . Glucosamine-Chondroit-Vit C-Mn (GLUCOSAMINE 1500 COMPLEX) CAPS Take one by mouth 2 times daily     . KRILL OIL PO Take 50 mg's by mouth daily     .  lisinopril-hydrochlorothiazide (ZESTORETIC) 20-25 MG tablet Take 1 tablet by mouth daily.    . Magnesium 250 MG TABS Take by mouth.    . Omega-3 Fatty Acids (FISH OIL) 1000 MG CAPS Take by mouth.    . Probiotic Product (PROBIOTIC FORMULA PO) Take by mouth.    . Red Yeast Rice 600 MG CAPS Take 2 capsules by mouth daily.      Marland Kitchen RESVERATROL 100 MG CAPS Take by mouth.    . Zinc 50 MG CAPS Take by mouth daily.    . potassium chloride SA (K-DUR,KLOR-CON) 20 MEQ tablet TAKE ONE TABLET BY MOUTH EVERY DAY.  NEED APPT FOR REFILLS (Patient not taking: Reported on 11/06/2019) 30 tablet 0   No current facility-administered medications for this visit.     Allergies:   Naprosyn [naproxen], Atorvastatin, Gluten meal, Rosuvastatin calcium, and Niacin and related   Social History:  The patient  reports that she quit smoking about 59 years ago. Her smoking use included cigarettes. She has a 24.00 pack-year smoking history. She has never used smokeless tobacco. She reports that she does not drink alcohol or use drugs.   Family History:   family history includes Cancer in her father; Coronary artery disease in her mother; Heart disease in her brother; Lupus in her daughter.    Review of Systems: Review of Systems  Constitutional: Negative.   HENT: Negative.   Respiratory: Positive for shortness of breath.   Cardiovascular: Positive for chest pain.  Gastrointestinal: Negative.   Musculoskeletal: Negative.   Neurological: Negative.   Psychiatric/Behavioral: Negative.   All other systems reviewed and are negative.    PHYSICAL EXAM: VS:  BP (!) 158/68 (BP Location: Right Arm, Patient Position: Sitting, Cuff Size: Normal)   Pulse 75   Ht 5\' 9"  (1.753 m)   Wt 150 lb (68 kg)   SpO2 98%   BMI 22.15 kg/m  , BMI Body mass index is 22.15 kg/m. GEN: Well nourished, well developed, in no acute distress HEENT: normal Neck: no JVD, carotid bruits, or masses Cardiac: RRR; 1-2/6 murmurs, rubs, or gallops,no edema  Respiratory:  clear to auscultation bilaterally, normal work of breathing GI: soft, nontender, nondistended, + BS MS: no deformity or atrophy Skin: warm and dry, no rash Neuro:  Strength and sensation are intact Psych: euthymic mood, full affect   Recent Labs: No results found for requested labs within last 8760 hours.    Lipid Panel Lab Results  Component Value Date   CHOL 260 (H) 05/09/2012   HDL 53.30 05/09/2012   TRIG 139.0 05/09/2012      Wt Readings from Last 3 Encounters:   11/06/19 150 lb (68 kg)  08/25/15 169 lb (76.7 kg)  07/14/15 178 lb (80.7 kg)       ASSESSMENT AND PLAN:  Problem List Items Addressed This Visit      Cardiology Problems   Hypertension   Relevant Medications   atorvastatin (LIPITOR) 10 MG tablet   lisinopril-hydrochlorothiazide (ZESTORETIC) 20-25 MG tablet    Other Visit Diagnoses    SOB (shortness of breath)    -  Primary   Chest pain of uncertain etiology       LBBB (left bundle branch block)       Relevant Medications   atorvastatin (LIPITOR) 10 MG tablet   lisinopril-hydrochlorothiazide (ZESTORETIC) 20-25 MG tablet   Nonspecific abnormal electrocardiogram (ECG) (EKG)       Relevant Orders   EKG 12-Lead     Etiology of her  shortness of breath chest tightness and new left bundle branch block unclear Very high risk for underlying ischemia given LDL 180, strong family history/mother of coronary disease and MI around the same age -She has shortness of breath walking her hill, no improvement in symptoms was repeat exercise Feels she should be doing much better than she is is limited by symptoms every morning -Discussed with her various treatment options, we have ordered a pharmacologic Myoview for the reasons above  Hyperlipidemia Long discussion concerning her hyperlipidemia LDL 180 dating back 8 years in our system Noncompliant with the Lipitor, does not really want to be on medications feels that she can control it with diet Stress test should give [place] calcium scoring equivalent on the CT attenuation corrected images which will help guide therapy  Disposition:   We will call her with the results   Total encounter time more than 60 minutes  Greater than 50% was spent in counseling and coordination of care with the patient    Signed, Esmond Plants, M.D., Ph.D. [place], [place]

## 2019-11-13 ENCOUNTER — Encounter
Admission: RE | Admit: 2019-11-13 | Discharge: 2019-11-13 | Disposition: A | Discharge status: Home / Self Care | Payer: Medicare Other | Source: Ambulatory Visit | Attending: Cardiovascular Disease | Admitting: Cardiovascular Disease

## 2019-11-13 ENCOUNTER — Other Ambulatory Visit: Payer: Self-pay

## 2019-11-13 DIAGNOSIS — R0602 Shortness of breath: Secondary | ICD-10-CM | POA: Diagnosis not present

## 2019-11-13 LAB — NM MYOCAR MULTI W/SPECT W/WALL MOTION / EF
LV dias vol: 107 mL (ref 46–106)
LV sys vol: 54 mL
Peak HR: 99 {beats}/min
Percent HR: 70 %
Rest HR: 70 {beats}/min
TID: 0.94

## 2019-11-13 MED ORDER — TECHNETIUM TC 99M TETROFOSMIN IV KIT
10.0000 | PACK | Freq: Once | INTRAVENOUS | Status: AC | PRN
  Administered 2019-11-13: 10.43 via INTRAVENOUS

## 2019-11-13 MED ORDER — TECHNETIUM TC 99M TETROFOSMIN IV KIT
28.9090 | PACK | Freq: Once | INTRAVENOUS | Status: AC | PRN
  Administered 2019-11-13: 28.909 via INTRAVENOUS

## 2019-11-13 MED ORDER — REGADENOSON 0.4 MG/5ML IV SOLN
0.4000 mg | Freq: Once | INTRAVENOUS | Status: AC
  Administered 2019-11-13: 0.4 mg via INTRAVENOUS

## 2019-11-19 ENCOUNTER — Ambulatory Visit: Payer: Medicare Other | Attending: Internal Medicine

## 2019-11-19 ENCOUNTER — Telehealth: Payer: Self-pay | Admitting: Cardiovascular Disease

## 2019-11-19 DIAGNOSIS — Z23 Encounter for immunization: Secondary | ICD-10-CM | POA: Insufficient documentation

## 2019-11-19 NOTE — Progress Notes (Signed)
   Covid-19 Vaccination Clinic  Name:  Breanna Hodge    MRN: [id number] DOB: 1940-12-24  11/19/2019  Breanna Hodge was observed post Covid-19 immunization for 15 minutes without incidence. She was provided with Vaccine Information Sheet and instruction to access the V-Safe system.   Breanna Hodge was instructed to call 911 with any severe reactions post vaccine: Marland Kitchen Difficulty breathing  . Swelling of your face and throat  . A fast heartbeat  . A bad rash all over your body  . Dizziness and weakness    Immunizations Administered    Name Date Dose VIS Date Route   Moderna COVID-19 Vaccine 11/19/2019  9:58 AM 0.5 mL 08/28/2019 Intramuscular   Manufacturer: Moderna   Lot: [id number]   [id number]

## 2019-11-19 NOTE — Telephone Encounter (Signed)
Reviewed results with patient and she verbalized understanding with no further questions.  

## 2019-11-19 NOTE — Telephone Encounter (Signed)
Patient calling in the get the results of stress test done 2/16. Patient will be available after 11:30 to talk  Please advise

## 2019-11-27 ENCOUNTER — Telehealth: Payer: Self-pay | Admitting: Gastroenterology

## 2019-11-27 ENCOUNTER — Telehealth: Payer: Self-pay | Admitting: Nurse Practitioner

## 2019-11-27 ENCOUNTER — Encounter: Payer: Self-pay | Admitting: Nurse Practitioner

## 2019-11-27 ENCOUNTER — Ambulatory Visit (INDEPENDENT_AMBULATORY_CARE_PROVIDER_SITE_OTHER): Payer: Medicare Other | Admitting: Gastroenterology

## 2019-11-27 ENCOUNTER — Ambulatory Visit (INDEPENDENT_AMBULATORY_CARE_PROVIDER_SITE_OTHER): Payer: Medicare Other | Admitting: Nurse Practitioner

## 2019-11-27 ENCOUNTER — Encounter: Payer: Self-pay | Admitting: Gastroenterology

## 2019-11-27 ENCOUNTER — Other Ambulatory Visit: Payer: Self-pay

## 2019-11-27 VITALS — BP 162/78 | HR 81 | Temp 96.3°F | Resp 18 | Ht 66.0 in | Wt 149.4 lb

## 2019-11-27 VITALS — BP 133/79 | HR 91 | Temp 98.1°F | Wt 148.0 lb

## 2019-11-27 DIAGNOSIS — Z86010 Personal history of colonic polyps: Secondary | ICD-10-CM | POA: Diagnosis not present

## 2019-11-27 DIAGNOSIS — E7800 Pure hypercholesterolemia, unspecified: Secondary | ICD-10-CM

## 2019-11-27 DIAGNOSIS — R140 Abdominal distension (gaseous): Secondary | ICD-10-CM

## 2019-11-27 DIAGNOSIS — I10 Essential (primary) hypertension: Secondary | ICD-10-CM

## 2019-11-27 DIAGNOSIS — R7309 Other abnormal glucose: Secondary | ICD-10-CM

## 2019-11-27 DIAGNOSIS — R194 Change in bowel habit: Secondary | ICD-10-CM | POA: Diagnosis not present

## 2019-11-27 LAB — BASIC METABOLIC PANEL
BUN: 18 mg/dL (ref 6–23)
CO2: 28 mEq/L (ref 19–32)
Calcium: 10.6 mg/dL — ABNORMAL HIGH (ref 8.4–10.5)
Chloride: 100 mEq/L (ref 96–112)
Creatinine, Ser: 0.6 mg/dL (ref 0.40–1.20)
GFR: 96.22 mL/min (ref >60.00)
Glucose, Bld: 93 mg/dL (ref 70–99)
Potassium: 3.4 mEq/L — ABNORMAL LOW (ref 3.5–5.1)
Sodium: 138 mEq/L (ref 135–145)

## 2019-11-27 LAB — LIPID PANEL
Cholesterol: 171 mg/dL (ref 0–200)
HDL: 62.7 mg/dL (ref >39.00)
LDL Cholesterol: 90 mg/dL (ref 0–99)
NonHDL: 108.59
Total CHOL/HDL Ratio: 3
Triglycerides: 93 mg/dL (ref 0.0–149.0)
VLDL: 18.6 mg/dL (ref 0.0–40.0)

## 2019-11-27 LAB — HEPATIC FUNCTION PANEL
ALT: 14 U/L (ref 0–35)
AST: 20 U/L (ref 0–37)
Albumin: 4.1 g/dL (ref 3.5–5.2)
Alkaline Phosphatase: 52 U/L (ref 39–117)
Bilirubin, Direct: 0 mg/dL (ref 0.0–0.3)
Total Bilirubin: 0.3 mg/dL (ref 0.2–1.2)
Total Protein: 7.3 g/dL (ref 6.0–8.3)

## 2019-11-27 LAB — HEMOGLOBIN A1C: Hgb A1c MFr Bld: 6 % (ref 4.6–6.5)

## 2019-11-27 MED ORDER — RIFAXIMIN 550 MG PO TABS: 550.0000 mg | ORAL_TABLET | Freq: Two times a day (BID) | ORAL | 0 refills | Status: DC

## 2019-11-27 MED ORDER — RIFAXIMIN 550 MG PO TABS: 550.0000 mg | ORAL_TABLET | Freq: Two times a day (BID) | ORAL | 0 refills | Status: AC

## 2019-11-27 NOTE — Telephone Encounter (Signed)
Tried to leave pt a message to Return in about 4 weeks (around 12/25/2019) for Please arrange CPE with one hour slot but patient doesn't have voicemail set up.

## 2019-11-27 NOTE — Telephone Encounter (Signed)
[facility] [address]

## 2019-11-27 NOTE — Progress Notes (Signed)
Subjective:    Patient ID: Breanna Hodge, female    DOB: 1941-05-15, 79 y.o.   MRN: [id number]  HPI  79 yo female comes in to establish care for chronic medical problems to include: hyperlipidemia, essential hypertension, and hyperglycemia without formal Dx of DM.  She needs refills of her atorvastatin 10 mg daily. Non smoker. Quit date 1961: 24 pack year No alcohol.   She has been having mild DOE and is undergoing evaluation by Dr. Rockey Situ. She had a GI upset and has an appt arranged with GI to explore this problem. She presents today without any specific complaints   HTN: She is taking lisinopril/hydrochlorothiazide 20/25 mg daily on a regular basis.  She has noted no edema, or side effects from the medication.  Her home blood pressure has ranged of [id number] systolic over unknown diastolic.  She reports white coat syndrome.  Review of records show that she has been historically resistant to adding new medication for hypertension.  Hx of DVT: wearing compression nose. No calf pain or edema. Resolved.   BP Readings from Last 3 Encounters:  11/27/19 133/79  11/27/19 (!) 162/78  11/06/19 (!) 158/68    Hyperlipidemia: She presents on Lipitor 10 mg daily and coenzyme co-Q10.  She denies any problems with muscle pain or aches, liver concerns, and seems to be tolerating the medication well.  Lab Results  Component Value Date   CHOL 171 11/27/2019   CHOL 260 (H) 05/09/2012   Lab Results  Component Value Date   HDL 62.70 11/27/2019   HDL 53.30 05/09/2012   Lab Results  Component Value Date   LDLCALC 90 11/27/2019   Lab Results  Component Value Date   TRIG 93.0 11/27/2019   TRIG 139.0 05/09/2012   Lab Results  Component Value Date   CHOLHDL 3 11/27/2019   CHOLHDL 5 05/09/2012   Lab Results  Component Value Date   LDLDIRECT 189.9 05/09/2012   Lab Results  Component Value Date   ALT 14 11/27/2019   AST 20 11/27/2019   ALKPHOS 52 11/27/2019   BILITOT 0.3  11/27/2019    Hyperglycemia: Hemoglobin A1c 5.8 which is considered in the prediabetes range.  She denies polyuria or polydipsia.  Lab Results  Component Value Date   HGBA1C 6.0 11/27/2019    Past Medical History:  Diagnosis Date  . Anemia   . Arthritis   . Clotting disorder (HCC)    DVT, RLE during pregnancy  . DLE (discoid lupus erythematosus)    diagnosed at [facility] by skin biopsy  . DVT (deep venous thrombosis) (HCC)    RLE, associated with pregnancy  . H/O: Bell's palsy    approx 15 yrs ago, resolved  . Hyperlipidemia   . Hypertension   . Rash    recurring on RLE, reported as discoid lupus by patient per [facility]  . Wears dentures    Full upper and lower. Does NOT wear lower.      Past Surgical History:  Procedure Laterality Date  . ABDOMINAL HYSTERECTOMY  1985   with bladder tack using mesh  . CATARACT EXTRACTION W/PHACO Left 07/14/2015   Procedure: CATARACT EXTRACTION PHACO AND INTRAOCULAR LENS PLACEMENT (IOC);  Surgeon: Ronnell Freshwater, MD;  Location: [place];  Service: Ophthalmology;  Laterality: Left;  PER DR OFFICE PT WANTS EARLY AS POSSIBLE  . CATARACT EXTRACTION W/PHACO Right 08/25/2015   Procedure: CATARACT EXTRACTION PHACO AND INTRAOCULAR LENS PLACEMENT (IOC);  Surgeon: Ronnell Freshwater, MD;  Location: [place];  Service: Ophthalmology;  Laterality: Right;  PER DR OFFICE PT WANTS AS EARLY AS POSSIBLE  . CHOLECYSTECTOMY  1987  . gyn surgery  1989   hysterectomy  . KNEE ARTHROSCOPY  2009   right  . KNEE ARTHROSCOPY  2007   right knee  . vein removal  2019   Right leg    Family History  Problem Relation Age of Onset  . Cancer Father        leukemia  . Coronary artery disease Mother        CABG  . Heart disease Brother        "heart problems"  . Lupus Daughter     Allergies  Allergen Reactions  . Naprosyn [Naproxen] Hives and Shortness Of Breath  . Atorvastatin Diarrhea and Nausea Only    Low bp  .  Gluten Meal Itching  . Rosuvastatin Calcium     Pt states loss of use on rt side    . Niacin And Related Rash    Current Outpatient Medications on File Prior to Visit  Medication Sig Dispense Refill  . atorvastatin (LIPITOR) 10 MG tablet Take 10 mg by mouth daily.    . B Complex Vitamins (VITAMIN B COMPLEX) TABS Take 1 tablet by mouth daily.    . calcium-vitamin D (OSCAL 500/200 D-3) 500-200 MG-UNIT per tablet Take 2 tablets by mouth daily.     . Cholecalciferol (VITAMIN D3) 1000 UNITS CAPS Take 1 capsule by mouth daily.    . Coenzyme Q10 (COQ-10) 200 MG CAPS 100 mg. Take one by mouth daily     . lisinopril-hydrochlorothiazide (ZESTORETIC) 20-25 MG tablet Take 1 tablet by mouth daily.    . Magnesium 250 MG TABS Take by mouth.    . Omega-3 Fatty Acids (FISH OIL) 1000 MG CAPS Take by mouth.    . Probiotic Product (PROBIOTIC FORMULA PO) Take by mouth.    . Red Yeast Rice 600 MG CAPS Take 2 capsules by mouth daily.      Marland Kitchen RESVERATROL 100 MG CAPS Take by mouth.    . Zinc 50 MG CAPS Take by mouth daily.    . rifaximin (XIFAXAN) 550 MG TABS tablet Take 1 tablet (550 mg total) by mouth 2 (two) times daily for 14 days. 28 tablet 0   No current facility-administered medications on file prior to visit.    BP (!) 162/78   Pulse 81   Temp (!) 96.3 F (35.7 C) (Temporal)   Resp 18   Ht 5\' 6"  (1.676 m)   Wt 149 lb 6.4 oz (67.8 kg)   SpO2 98%   BMI 24.11 kg/m   Review of Systems  Constitutional: Negative for activity change, appetite change, fever and unexpected weight change.  HENT: Negative.  Negative for congestion and sore throat.   Respiratory: Positive for shortness of breath (Positive DOE-active w/up by cardiology. Negative stress test). Negative for cough and chest tightness.   Cardiovascular: Negative for leg swelling.  Gastrointestinal: Negative.  Negative for abdominal pain and diarrhea.       History of diarrhea following [place] food that went on for about 3 months.  That is  now improved but she is going to see gastroenterology for evaluation.  Genitourinary: Negative.  Negative for difficulty urinating and dysuria.  Musculoskeletal: Negative for back pain and joint swelling.  Skin: Negative for rash.  Neurological: Negative for dizziness and light-headedness.  Hematological: Negative for adenopathy.  Psychiatric/Behavioral: Negative.  No concerns about depression or anxiety       Objective:   Physical Exam Vitals reviewed.  Constitutional:      Appearance: Normal appearance.  HENT:     Head: Normocephalic.  Cardiovascular:     Rate and Rhythm: Normal rate and regular rhythm.  Pulmonary:     Effort: Pulmonary effort is normal.     Breath sounds: Normal breath sounds.  Abdominal:     General: Abdomen is flat.     Palpations: Abdomen is soft.  Musculoskeletal:        General: No swelling.  Skin:    General: Skin is dry.  Neurological:     Mental Status: She is alert and oriented to person, place, and time.  Psychiatric:        Mood and Affect: Mood normal.        Behavior: Behavior normal.        Assessment & Plan:   Hypertension: Patient is taking her lisinopril/HCTZ on a daily basis.  She reports her blood pressures are up and down.  She did show a decrease in her blood pressure after 5 minutes of rest.  Patient advised to keep blood pressure record and bring it into the next visit.  She is to continue to eat a healthy low-salt DASH diet.  Hyperlipidemia: Patient remains on Lipitor without any complaints.  She has improvement in her lipid panel, and normal liver enzymes.   Slight elevation in glucose with A1c in the prediabetes range: To continue with lifestyle management at this time.    Slight DOE: Patient has questions and concerns about her stress test and her heart. Advised to return to Dr. Rockey Situ for your continued complaints shortness of breath. Distant smoking history noted. Lung fields clear.  If there is persists consider  chest x-ray at next visit.  She reports history of diarrhea following [place] food and has an appointment with Gastroenterology range.  Return in one month for CPE as you are due for mammogram and colonoscopy. She declines flu shot.   This visit occurred during the SARS-CoV-2 public health emergency.  Safety protocols were in place, including screening questions prior to the visit, additional usage of staff PPE, and extensive cleaning of exam room while observing appropriate contact time as indicated for disinfecting solutions.   Denice Paradise, ANP This note is not being shared with the patient for the following reason:

## 2019-11-27 NOTE — Telephone Encounter (Signed)
Patient called and states she was here today & was seen by DR Marius Ditch. She had a prescription called into [facility] & [facility] [address].

## 2019-11-27 NOTE — Patient Instructions (Addendum)
Please return to Dr. Rockey Situ for your continued complaints shortness of breath.   Your BP and cholesterol are elevated and will recheck labs.  BP 162/78.   Return in one month for CPE as you are due for mammogram  Please obtain medical release from Dr. Vedia Coffer  Please keep a BP record and bring it in to next visit. Continue to eat a healthy,  low salt DASH diet.

## 2019-11-27 NOTE — Progress Notes (Signed)
Breanna Darby, MD [address]  [place]  [place], [place] [address]  Main: [phone]  Fax: [phone]    Gastroenterology Consultation  Referring Provider:     Casilda Carls, MD Primary Care Physician:  Marval Regal, NP Primary Gastroenterologist:  Dr. Cephas Hodge Reason for Consultation:    History of diarrhea        HPI:   Breanna Hodge is a 79 y.o. female referred by Dr. Jerelene Redden, Janalyn Harder, NP  for consultation & management of history of diarrhea.  Patient reports that since October, she was experiencing 4-5 episodes of watery bowel movements and abdominal cramps after she had some [place] food.  This has recently subsided.  She was having diarrhea when the referral was placed.  She reports having 1-2 soft bowel movements daily.  Her weight has been stable.  She does report abdominal bloating which is her main concern today.  She has been taking probiotic which does not seem to be helping. She does wear panty liner due to concern for fecal incontinence.  She had 11 pregnancies, all were vaginal deliveries and she reports that this has led to difficulty controlling her bowels.  Therefore, has been waiting pantiliner for last several years..  She has 51 grandchildren.  She moved to [place] in 2019 from [place].  She reports having history of several polyps that were removed before moving to [place].  Review of her labs from 06/2019 revealed normal CBC, CMP  Patient was seen by Dr Rockey Situ on 11/06/2019 for abnormal EKG, work-up has been negative so far.  She underwent nuclear medicine stress test which did not reveal major perfusion abnormality concerning for major blockage. She does not smoke or drink alcohol  NSAIDs: None  Antiplts/Anticoagulants/Anti thrombotics: None  GI Procedures: Colonoscopy in 2019, multiple polyps removed  Past Medical History:  Diagnosis Date  . Anemia   . Arthritis   . Clotting disorder (HCC)    DVT, RLE during  pregnancy  . DLE (discoid lupus erythematosus)    diagnosed at [facility] by skin biopsy  . DVT (deep venous thrombosis) (HCC)    RLE, associated with pregnancy  . H/O: Bell's palsy    approx 15 yrs ago, resolved  . Hyperlipidemia   . Hypertension   . Rash    recurring on RLE, reported as discoid lupus by patient per prior [facility] eval  . Wears dentures    Full upper and lower. Does NOT wear lower.    Past Surgical History:  Procedure Laterality Date  . ABDOMINAL HYSTERECTOMY  1985   with bladder tack using mesh  . CATARACT EXTRACTION W/PHACO Left 07/14/2015   Procedure: CATARACT EXTRACTION PHACO AND INTRAOCULAR LENS PLACEMENT (IOC);  Surgeon: Ronnell Freshwater, MD;  Location: [facility];  Service: Ophthalmology;  Laterality: Left;  PER DR OFFICE PT WANTS EARLY AS POSSIBLE  . CATARACT EXTRACTION W/PHACO Right 08/25/2015   Procedure: CATARACT EXTRACTION PHACO AND INTRAOCULAR LENS PLACEMENT (IOC);  Surgeon: Ronnell Freshwater, MD;  Location: [facility];  Service: Ophthalmology;  Laterality: Right;  PER DR OFFICE PT WANTS AS EARLY AS POSSIBLE  . CHOLECYSTECTOMY  1987  . gyn surgery  1989   hysterectomy  . KNEE ARTHROSCOPY  2009   right  . KNEE ARTHROSCOPY  2007   right knee  . vein removal  2019   Right leg    Current Outpatient Medications:  .  atorvastatin (LIPITOR) 10 MG tablet, Take 10  mg by mouth daily., Disp: , Rfl:  .  B Complex Vitamins (VITAMIN B COMPLEX) TABS, Take 1 tablet by mouth daily., Disp: , Rfl:  .  calcium-vitamin D (OSCAL 500/200 D-3) 500-200 MG-UNIT per tablet, Take 2 tablets by mouth daily. , Disp: , Rfl:  .  Cholecalciferol (VITAMIN D3) 1000 UNITS CAPS, Take 1 capsule by mouth daily., Disp: , Rfl:  .  Coenzyme Q10 (COQ-10) 200 MG CAPS, 100 mg. Take one by mouth daily , Disp: , Rfl:  .  lisinopril-hydrochlorothiazide (ZESTORETIC) 20-25 MG tablet, Take 1 tablet by mouth daily., Disp: , Rfl:  .  Magnesium 250 MG TABS, Take by mouth., Disp:  , Rfl:  .  Omega-3 Fatty Acids (FISH OIL) 1000 MG CAPS, Take by mouth., Disp: , Rfl:  .  Probiotic Product (PROBIOTIC FORMULA PO), Take by mouth., Disp: , Rfl:  .  Red Yeast Rice 600 MG CAPS, Take 2 capsules by mouth daily.  , Disp: , Rfl:  .  RESVERATROL 100 MG CAPS, Take by mouth., Disp: , Rfl:  .  Zinc 50 MG CAPS, Take by mouth daily., Disp: , Rfl:  .  rifaximin (XIFAXAN) 550 MG TABS tablet, Take 1 tablet (550 mg total) by mouth 2 (two) times daily for 14 days., Disp: 28 tablet, Rfl: 0 .  rifaximin (XIFAXAN) 550 MG TABS tablet, Take 1 tablet (550 mg total) by mouth 2 (two) times daily for 14 days., Disp: 28 tablet, Rfl: 0   Family History  Problem Relation Age of Onset  . Cancer Father        leukemia  . Coronary artery disease Mother        CABG  . Heart disease Brother        "heart problems"  . Lupus Daughter      Social History   Tobacco Use  . Smoking status: Former Smoker    Packs/day: 4.00    Years: 6.00    Pack years: 24.00    Types: Cigarettes    Quit date: 05/25/1960    Years since quitting: 59.5  . Smokeless tobacco: Never Used  . Tobacco comment: Quit over 46 years ago  Substance Use Topics  . Alcohol use: No  . Drug use: No    Allergies as of 11/27/2019 - Review Complete 11/27/2019  Allergen Reaction Noted  . Naprosyn [naproxen] Hives and Shortness Of Breath 05/26/2011  . Atorvastatin Diarrhea and Nausea Only 07/14/2018  . Gluten meal Itching 03/05/2013  . Rosuvastatin calcium  05/15/2018  . Niacin and related Rash 07/07/2015    Review of Systems:    All systems reviewed and negative except where noted in HPI.   Physical Exam:  BP 133/79 (BP Location: Left Arm, Patient Position: Sitting, Cuff Size: Normal)   Pulse 91   Temp 98.1 F (36.7 C) (Oral)   Wt 148 lb (67.1 kg)   BMI 23.89 kg/m  No LMP recorded. Patient has had a hysterectomy.  General:   Alert,  Well-developed, well-nourished, pleasant and cooperative in NAD Head:  Normocephalic and  atraumatic. Eyes:  Sclera clear, no icterus.   Conjunctiva pink. Ears:  Normal auditory acuity. Nose:  No deformity, discharge, or lesions. Mouth:  No deformity or lesions,oropharynx pink & moist. Neck:  Supple; no masses or thyromegaly. Lungs:  Respirations even and unlabored.  Clear throughout to auscultation.   No wheezes, crackles, or rhonchi. No acute distress. Heart:  Regular rate and rhythm; no murmurs, clicks, rubs, or gallops. Abdomen:  Normal  bowel sounds. Soft, non-tender and non-distended without masses, hepatosplenomegaly or hernias noted.  No guarding or rebound tenderness.   Rectal: Not performed Msk:  Symmetrical without gross deformities. Good, equal movement & strength bilaterally. Pulses:  Normal pulses noted. Extremities:  No clubbing or edema.  No cyanosis. Neurologic:  Alert and oriented x3;  grossly normal neurologically. Skin:  Intact without significant lesions or rashes. No jaundice. Psych:  Alert and cooperative. Normal mood and affect.  Imaging Studies: Reviewed  Assessment and Plan:   NALEIGH BOOKHART is a 79 y.o. female with history of hypertension is seen in consultation for 3 months history of nonbloody diarrhea which has occurred after eating [place] food.  The diarrhea has resolved.  Most likely secondary to gastroenteritis after eating contaminated food.  Patient has loose stools associated with abdominal bloating.  Probiotics are not helping, therefore advised patient to stop.  We will try 2 weeks course of rifaximin for possible bacterial overgrowth  Personal history of colon polyps Recommend surveillance colonoscopy after obtaining cardiac clearance from Dr. Rockey Situ  Follow up in 1 month   Breanna Darby, MD

## 2019-11-28 ENCOUNTER — Telehealth: Payer: Self-pay

## 2019-11-28 ENCOUNTER — Telehealth: Payer: Self-pay | Admitting: Nurse Practitioner

## 2019-11-28 ENCOUNTER — Telehealth: Payer: Self-pay | Admitting: Lab

## 2019-11-28 ENCOUNTER — Telehealth: Payer: Self-pay | Admitting: Cardiovascular Disease

## 2019-11-28 ENCOUNTER — Telehealth: Payer: Self-pay | Admitting: Gastroenterology

## 2019-11-28 ENCOUNTER — Encounter: Payer: Self-pay | Admitting: Nurse Practitioner

## 2019-11-28 NOTE — Telephone Encounter (Signed)
   [facility] Pre-operative Risk Assessment    Request for surgical clearance:  1. What type of surgery is being performed? Colonoscopy  2. When is this surgery scheduled? TBD once patient calls back   3. What type of clearance is required (medical clearance vs. Pharmacy clearance to hold med vs. Both)? Medical clearance   4. Are there any medications that need to be held prior to surgery and how long?none   5. Practice name and name of physician performing surgery? Dr. Marius Ditch with [facility]    6. What is your office phone number [phone]    7.   What is your office fax number [phone]  8.   Anesthesia type (None, local, MAC, general) ? General    Breanna Hodge 11/28/2019, 8:54 AM  _________________________________________________________________   (provider comments below)

## 2019-11-28 NOTE — Telephone Encounter (Signed)
Pt called wanting to speck to K.Jerelene Redden about a colonoscopy an where she would like to refer her to

## 2019-11-28 NOTE — Telephone Encounter (Signed)
Called Pt back and she stated that she would like a referral put in to have a Colonoscopy put in at [facility] in [place].

## 2019-11-28 NOTE — Telephone Encounter (Signed)
Called Pt back with lab results, Pt stated she understood with No questions. I also reminded Pt of her next appt and to bring her BP readings.

## 2019-11-28 NOTE — Telephone Encounter (Signed)
Called Pt No answer, VM not setup. Will try back later.

## 2019-11-28 NOTE — Telephone Encounter (Signed)
Called Pt back  I told Pt that she would need to contact Dr. Darvin Neighbours at the GI department concerning the referral Dr. Darvin Neighbours put in for her colonoscopy. Pt doesn't like the location, date, and time they gave her for  for her colonoscopy which was [facility].  Pt stated she will go to [facility] herself and talk to them about setting up an appt.

## 2019-11-28 NOTE — Telephone Encounter (Signed)
   [facility] Pre-operative Risk Assessment    Request for surgical clearance:  1. What type of surgery is being performed? COLONOSCOPY  2. When is this surgery scheduled? TBD  3. What type of clearance is required (medical clearance vs. Pharmacy clearance to hold med vs. Both)? NOT LISTED  4. Are there any medications that need to be held prior to surgery and how long? NOT LISTED  5. Practice name and name of physician performing surgery? [facility], PHYSICIAN NOT LISTED  6. What is your office phone number [phone]   7.   What is your office fax number [phone]  8.   Anesthesia type (None, local, MAC, general) ? GENERAL   Lucienne Minks 11/28/2019, 3:00 PM  _________________________________________________________________   (provider comments below)

## 2019-11-28 NOTE — Telephone Encounter (Signed)
Patient called back to scheduled colonoscopy. Patient states she can drive her own self to the procedure she did in [place]. Informed patient that our policy is you have to have some drive you to the procedure and drive you home from the procedure. Patient states well she does not know if she will be able to get anyone to drive her to the procedure. Informed patient the procedure would be in the morning and she would call the number on paper work to find out what time her procedure. She states how is she supposed to find someone to take her if she does not know the time of the procedure. Informed patient that she could tell the driver it would be in the morning and if they needed to bring her to the procedure earlier then the procedure time then they could and she would wait in the endo unit till her procedure time. Informed patient that we could call her driver to let them know when she was ready to be pick up. Patient then agreed to procedure on 12/17/2019. Informed patient she would go for her COVID test on 12/13/2019. Patient states that she has had one covid shot and will go for her second one later that week. Patient states she does not need COVID test then. Informed patient that it is still policy even if you had both COVID vaccines you have to have the covid test. Patient states she will not have the colonoscopy then. Patient states that she will not have a covid test done.

## 2019-11-28 NOTE — Telephone Encounter (Signed)
Patient called & l/m on v/m stating she is returning a call to schedule a colonoscopy.

## 2019-11-28 NOTE — Telephone Encounter (Signed)
-----   Message from Lin Landsman, MD sent at 11/27/2019  6:22 PM EST ----- Regarding: Re: colonoscopy Schedule surveillance colonoscopyDiagnosis: personal history of colon polypsWe need to obtain cardiac clearance from Dr. Rockey Situ  Thanks RV

## 2019-11-28 NOTE — Telephone Encounter (Signed)
Patient cardiologist cleared patient for colonoscopy. The colonoscopy is for History of colon polyps. Called patient and patient states she will give [place] a call back when she is ready to scheduled the procedure

## 2019-11-28 NOTE — Telephone Encounter (Signed)
Tried to call patient to set the colonoscopy up but voicemail is not set up. Sent cardiac clearance to Dr. Rockey Situ.

## 2019-11-28 NOTE — Telephone Encounter (Signed)
Documented in the other telephone call

## 2019-11-28 NOTE — Telephone Encounter (Signed)
Pt called office Pt called wanting to speck to K.Jerelene Redden about a colonoscopy an where she would like to refer her to

## 2019-11-28 NOTE — Telephone Encounter (Signed)
Pt called back returning your call °

## 2019-11-28 NOTE — Telephone Encounter (Signed)
   Primary Cardiologist: No primary care provider on file.  Chart reviewed as part of pre-operative protocol coverage. Given past medical history and time since last visit, based on ACC/AHA guidelines, [place] would be at acceptable risk for the planned procedure without further cardiovascular testing. Patient was recently evaluated for dyspnea, myocardial stress test obtained on 11/13/2019 showed no significant ischemia.   I will route this recommendation to the requesting party via Epic fax function and remove from pre-op pool.  Please call with questions.  Almyra Deforest, [place] 11/28/2019, 9:37 AM

## 2019-11-29 ENCOUNTER — Telehealth: Payer: Self-pay

## 2019-11-29 ENCOUNTER — Other Ambulatory Visit: Payer: Self-pay

## 2019-11-29 DIAGNOSIS — Z86010 Personal history of colonic polyps: Secondary | ICD-10-CM

## 2019-11-29 MED ORDER — NA SULFATE-K SULFATE-MG SULF 17.5-3.13-1.6 GM/177ML PO SOLN: 354.0000 mL | Freq: Once | ORAL | 0 refills | Status: AC

## 2019-11-29 NOTE — Telephone Encounter (Signed)
Patient came by the office and she states she will do the procedure on 12/12/2019. Went over instructions with patient and gave them to her. Informed patient many times she would have to have someone drive her and drive her home from the procedure.

## 2019-11-29 NOTE — Telephone Encounter (Signed)
   Primary Cardiologist: Ida Rogue, MD  Chart reviewed as part of pre-operative protocol coverage. Given past medical history and time since last visit, based on ACC/AHA guidelines, Breanna Hodge would be at acceptable risk for the planned procedure without further cardiovascular testing. Recent myocardial stress test was unchanged when compare to 2019 study, no significant ischemia.  I will route this recommendation to the requesting party via Epic fax function and remove from pre-op pool.  Please call with questions.   [place], [place] 11/29/2019, 10:13 AM

## 2019-11-30 ENCOUNTER — Telehealth: Payer: Self-pay | Admitting: Lab

## 2019-11-30 ENCOUNTER — Telehealth: Payer: Self-pay

## 2019-11-30 NOTE — Telephone Encounter (Signed)
Patient is calling because the Rifiaxmin is 600 dollars and she can not afford this. Patient states that she is having no symptoms now.

## 2019-11-30 NOTE — Telephone Encounter (Signed)
Called Pt about coming into our office to sign a Medical release form. I explained to Pt  so that we can get copies of her medical records from  Dr. Rosario Jacks at [facility]  medicine [address] [place] [address] [phone] Fax [phone]  Pt stated she will come into our office on 3/8/202.

## 2019-12-06 ENCOUNTER — Telehealth: Payer: Self-pay | Admitting: Cardiovascular Disease

## 2019-12-06 DIAGNOSIS — R0602 Shortness of breath: Secondary | ICD-10-CM

## 2019-12-06 NOTE — Telephone Encounter (Signed)
Patient dropped off BP readings to be reviewed °Placed in nurse box  °

## 2019-12-06 NOTE — Telephone Encounter (Signed)
BP readings received and pre-lim reviewed by RN.  BP readings placed on Dr. Donivan Scull nurse Jeannene Patella, RN desk for review .

## 2019-12-07 NOTE — Telephone Encounter (Signed)
Spoke with patient and she states that over the last 2 years she has been walking her dog up this hill at her home. Recently she has noticed that she has not been able to make it up that hill. She did drop off a list of her blood pressure readings and states that she is has not had any shortness of breath, and only this feeling that her legs are heavy. She now walks down the hill with no problems. Inquired if she was taking blood pressures before or after medications. She states that she takes her lisinopril-hydrochlorothiazide at bedtime so pressures are throughout the day. These were the readings that she brought into office. Range 131-149 over 68-87. Advised that I would forward it to provider for review and would be in touch with any recommendations. She verbalized understanding with no further questions   Blood pressures were as follows:  2/19 131/74 HR 40 2/20 144/74 HR 80 2/21 149/76 HR 73 2/22 147/87 HR 70 2/23 142/77 HR 72 2/24 134/71 HR 71 2/25 145/80 HR 70 2/26 135/75 HR 67 2/27 148/78 HR 68 2/28 134/74 HR 79 3/1   147/80 HR 71 3/2   139/68 HR 73 3/3   149/75 HR 78 3/4   148/79 HR 77 3/5   131/74 HR 80 3/6   144/75 HR 80 3/7   142/74 HR 80 3/8   144/77 HR 71 3/12 138/72

## 2019-12-09 NOTE — Telephone Encounter (Signed)
Would start amlodipine 5 mg daily for blood pressure Needs regular walking program, in addition to dog walk (daily) Consider echo, for SOB, above sx, none on file Stress test no significant ischemia

## 2019-12-10 ENCOUNTER — Other Ambulatory Visit
Admission: RE | Admit: 2019-12-10 | Discharge: 2019-12-10 | Disposition: A | Discharge status: Home / Self Care | Payer: Medicare Other | Source: Ambulatory Visit | Attending: Gastroenterology | Admitting: Gastroenterology

## 2019-12-10 ENCOUNTER — Other Ambulatory Visit: Payer: Self-pay

## 2019-12-10 DIAGNOSIS — Z01812 Encounter for preprocedural laboratory examination: Secondary | ICD-10-CM | POA: Insufficient documentation

## 2019-12-10 DIAGNOSIS — Z20822 Contact with and (suspected) exposure to covid-19: Secondary | ICD-10-CM | POA: Diagnosis not present

## 2019-12-10 LAB — SARS CORONAVIRUS 2 (TAT 6-24 HRS): SARS Coronavirus 2: NEGATIVE

## 2019-12-10 NOTE — Telephone Encounter (Signed)
Spoke with patient and reviewed provider recommendations. She reports that she has been on amlodipine before and it made her blood pressures more elevated. So she doesn't want to try that again. Reviewed walking program and she reports walking dog for 3 hours per day and after that she just is not able to do another walk. Let her know that I would check with provider and then be in touch with any other recommendations. She verbalized understanding with no further questions at this time.

## 2019-12-11 ENCOUNTER — Encounter: Payer: Self-pay | Admitting: Gastroenterology

## 2019-12-12 ENCOUNTER — Encounter: Payer: Self-pay | Admitting: Gastroenterology

## 2019-12-12 ENCOUNTER — Ambulatory Visit
Admission: RE | Admit: 2019-12-12 | Discharge: 2019-12-12 | Disposition: A | Discharge status: Home / Self Care | Payer: Medicare Other | Source: Ambulatory Visit | Attending: Gastroenterology | Admitting: Gastroenterology

## 2019-12-12 ENCOUNTER — Encounter
Admission: RE | Disposition: A | Discharge status: Home / Self Care | Payer: Self-pay | Source: Ambulatory Visit | Attending: Gastroenterology

## 2019-12-12 ENCOUNTER — Other Ambulatory Visit: Payer: Self-pay

## 2019-12-12 ENCOUNTER — Ambulatory Visit: Payer: Medicare Other | Admitting: Anesthesiology

## 2019-12-12 DIAGNOSIS — K648 Other hemorrhoids: Secondary | ICD-10-CM | POA: Insufficient documentation

## 2019-12-12 DIAGNOSIS — K644 Residual hemorrhoidal skin tags: Secondary | ICD-10-CM | POA: Diagnosis not present

## 2019-12-12 DIAGNOSIS — Z860100 Personal history of colon polyps, unspecified: Secondary | ICD-10-CM

## 2019-12-12 DIAGNOSIS — Z79899 Other long term (current) drug therapy: Secondary | ICD-10-CM | POA: Diagnosis not present

## 2019-12-12 DIAGNOSIS — I10 Essential (primary) hypertension: Secondary | ICD-10-CM | POA: Diagnosis not present

## 2019-12-12 DIAGNOSIS — E785 Hyperlipidemia, unspecified: Secondary | ICD-10-CM | POA: Insufficient documentation

## 2019-12-12 DIAGNOSIS — Z961 Presence of intraocular lens: Secondary | ICD-10-CM | POA: Insufficient documentation

## 2019-12-12 DIAGNOSIS — Z806 Family history of leukemia: Secondary | ICD-10-CM | POA: Diagnosis not present

## 2019-12-12 DIAGNOSIS — Z8249 Family history of ischemic heart disease and other diseases of the circulatory system: Secondary | ICD-10-CM | POA: Diagnosis not present

## 2019-12-12 DIAGNOSIS — Z791 Long term (current) use of non-steroidal anti-inflammatories (NSAID): Secondary | ICD-10-CM | POA: Insufficient documentation

## 2019-12-12 DIAGNOSIS — Z9842 Cataract extraction status, left eye: Secondary | ICD-10-CM | POA: Diagnosis not present

## 2019-12-12 DIAGNOSIS — M1990 Unspecified osteoarthritis, unspecified site: Secondary | ICD-10-CM | POA: Insufficient documentation

## 2019-12-12 DIAGNOSIS — Z87891 Personal history of nicotine dependence: Secondary | ICD-10-CM | POA: Insufficient documentation

## 2019-12-12 DIAGNOSIS — K633 Ulcer of intestine: Secondary | ICD-10-CM | POA: Insufficient documentation

## 2019-12-12 DIAGNOSIS — D649 Anemia, unspecified: Secondary | ICD-10-CM | POA: Diagnosis not present

## 2019-12-12 DIAGNOSIS — K9041 Non-celiac gluten sensitivity: Secondary | ICD-10-CM | POA: Diagnosis not present

## 2019-12-12 DIAGNOSIS — Z888 Allergy status to other drugs, medicaments and biological substances status: Secondary | ICD-10-CM | POA: Insufficient documentation

## 2019-12-12 DIAGNOSIS — Z9049 Acquired absence of other specified parts of digestive tract: Secondary | ICD-10-CM | POA: Diagnosis not present

## 2019-12-12 DIAGNOSIS — Z86718 Personal history of other venous thrombosis and embolism: Secondary | ICD-10-CM | POA: Insufficient documentation

## 2019-12-12 DIAGNOSIS — L930 Discoid lupus erythematosus: Secondary | ICD-10-CM | POA: Insufficient documentation

## 2019-12-12 DIAGNOSIS — Z90710 Acquired absence of both cervix and uterus: Secondary | ICD-10-CM | POA: Insufficient documentation

## 2019-12-12 DIAGNOSIS — K5730 Diverticulosis of large intestine without perforation or abscess without bleeding: Secondary | ICD-10-CM | POA: Insufficient documentation

## 2019-12-12 DIAGNOSIS — Z1211 Encounter for screening for malignant neoplasm of colon: Secondary | ICD-10-CM | POA: Insufficient documentation

## 2019-12-12 DIAGNOSIS — D689 Coagulation defect, unspecified: Secondary | ICD-10-CM | POA: Insufficient documentation

## 2019-12-12 DIAGNOSIS — Z9841 Cataract extraction status, right eye: Secondary | ICD-10-CM | POA: Insufficient documentation

## 2019-12-12 DIAGNOSIS — K6389 Other specified diseases of intestine: Secondary | ICD-10-CM | POA: Diagnosis not present

## 2019-12-12 DIAGNOSIS — Z8489 Family history of other specified conditions: Secondary | ICD-10-CM | POA: Insufficient documentation

## 2019-12-12 HISTORY — PX: COLONOSCOPY WITH PROPOFOL: SHX5780

## 2019-12-12 SURGERY — COLONOSCOPY WITH PROPOFOL
Anesthesia: General

## 2019-12-12 MED ORDER — PHENYLEPHRINE HCL (PRESSORS) 10 MG/ML IV SOLN
INTRAVENOUS | Status: AC
  Filled 2019-12-12: qty 1

## 2019-12-12 MED ORDER — LIDOCAINE HCL (PF) 2 % IJ SOLN
INTRAMUSCULAR | Status: AC
  Filled 2019-12-12: qty 10

## 2019-12-12 MED ORDER — SODIUM CHLORIDE 0.9 % IV SOLN
INTRAVENOUS | Status: DC
  Administered 2019-12-12: 1000 mL via INTRAVENOUS

## 2019-12-12 MED ORDER — PROPOFOL 10 MG/ML IV BOLUS
INTRAVENOUS | Status: DC | PRN
  Administered 2019-12-12 (×2): 50 mg via INTRAVENOUS

## 2019-12-12 MED ORDER — PROPOFOL 500 MG/50ML IV EMUL
INTRAVENOUS | Status: AC
  Filled 2019-12-12: qty 50

## 2019-12-12 MED ORDER — PROPOFOL 500 MG/50ML IV EMUL
INTRAVENOUS | Status: DC | PRN
  Administered 2019-12-12: 100 ug/kg/min via INTRAVENOUS

## 2019-12-12 NOTE — Telephone Encounter (Signed)
I reviewed her colonoscopy report done today. She came in with BP 180/85 and HR 100. She had reported white coat syndrome.   PLAN: I would like to see her in the next 2 weeks for a sooner recheck than April  if that can be arranged.   Please contact her later today for a sooner appt.

## 2019-12-12 NOTE — Anesthesia Postprocedure Evaluation (Signed)
Anesthesia Post Note  Patient: Breanna Hodge  Procedure(s) Performed: COLONOSCOPY WITH PROPOFOL (N/A )  Patient location during evaluation: Endoscopy Anesthesia Type: General Level of consciousness: awake and alert Pain management: pain level controlled Vital Signs Assessment: post-procedure vital signs reviewed and stable Respiratory status: spontaneous breathing, nonlabored ventilation, respiratory function stable and patient connected to nasal cannula oxygen Cardiovascular status: blood pressure returned to baseline and stable Postop Assessment: no apparent nausea or vomiting Anesthetic complications: no     Last Vitals:  Vitals:   12/12/19 1013 12/12/19 1023  BP: (!) 149/73 (!) 155/71  Pulse:    Resp:    Temp:    SpO2:      Last Pain:  Vitals:   12/12/19 1023  TempSrc:   PainSc: 0-No pain                 Martha Clan

## 2019-12-12 NOTE — H&P (Signed)
Breanna Darby, MD [address]  [place]  [place], [place] [address]  Main: [phone]  Fax: [phone] Pager: [phone]  Primary Care Physician:  Marval Regal, NP Primary Gastroenterologist:  Dr. Cephas Hodge  Pre-Procedure History & Physical: HPI:  Breanna Hodge is a 79 y.o. female is here for an colonoscopy.   Past Medical History:  Diagnosis Date  . Anemia   . Arthritis   . Clotting disorder (HCC)    DVT, RLE during pregnancy  . DLE (discoid lupus erythematosus)    diagnosed at [facility] by skin biopsy  . DVT (deep venous thrombosis) (HCC)    RLE, associated with pregnancy  . H/O: Bell's palsy    approx 15 yrs ago, resolved  . Hyperlipidemia   . Hypertension   . Rash    recurring on RLE, reported as discoid lupus by patient per prior [facility] eval  . Wears dentures    Full upper and lower. Does NOT wear lower.    Past Surgical History:  Procedure Laterality Date  . ABDOMINAL HYSTERECTOMY  1985   with bladder tack using mesh  . CATARACT EXTRACTION W/PHACO Left 07/14/2015   Procedure: CATARACT EXTRACTION PHACO AND INTRAOCULAR LENS PLACEMENT (IOC);  Surgeon: Ronnell Freshwater, MD;  Location: [place];  Service: Ophthalmology;  Laterality: Left;  PER DR OFFICE PT WANTS EARLY AS POSSIBLE  . CATARACT EXTRACTION W/PHACO Right 08/25/2015   Procedure: CATARACT EXTRACTION PHACO AND INTRAOCULAR LENS PLACEMENT (IOC);  Surgeon: Ronnell Freshwater, MD;  Location: [place];  Service: Ophthalmology;  Laterality: Right;  PER DR OFFICE PT WANTS AS EARLY AS POSSIBLE  . CHOLECYSTECTOMY  1987  . EYE SURGERY    . gyn surgery  1989   hysterectomy  . KNEE ARTHROSCOPY  2009   right  . KNEE ARTHROSCOPY  2007   right knee  . vein removal  2019   Right leg    Prior to Admission medications   Medication Sig Start Date End Date Taking? Authorizing Provider  atorvastatin (LIPITOR) 10 MG tablet Take 10 mg by mouth daily. 10/26/19    [provider]  B Complex Vitamins (VITAMIN B COMPLEX) TABS Take 1 tablet by mouth daily.    [provider]  calcium-vitamin D (OSCAL 500/200 D-3) 500-200 MG-UNIT per tablet Take 2 tablets by mouth daily.     [provider]  Cholecalciferol (VITAMIN D3) 1000 UNITS CAPS Take 1 capsule by mouth daily.    [provider]  Coenzyme Q10 (COQ-10) 200 MG CAPS 100 mg. Take one by mouth daily     [provider]  lisinopril-hydrochlorothiazide (ZESTORETIC) 20-25 MG tablet Take 1 tablet by mouth daily. 08/27/19   [provider]  Magnesium 250 MG TABS Take by mouth.    [provider]  Omega-3 Fatty Acids (FISH OIL) 1000 MG CAPS Take by mouth.    [provider]  Probiotic Product (PROBIOTIC FORMULA PO) Take by mouth.    [provider]  Red Yeast Rice 600 MG CAPS Take 2 capsules by mouth daily.      [provider]  RESVERATROL 100 MG CAPS Take by mouth.    [provider]  Zinc 50 MG CAPS Take by mouth daily.    [provider]    Allergies as of 11/29/2019 - Review Complete 11/28/2019  Allergen Reaction Noted  . Naprosyn [naproxen] Hives and Shortness Of Breath 05/26/2011  . Atorvastatin Diarrhea and Nausea Only 07/14/2018  .  Gluten meal Itching 03/05/2013  . Rosuvastatin calcium  05/15/2018  . Niacin and related Rash 07/07/2015    Family History  Problem Relation Age of Onset  . Cancer Father        leukemia  . Coronary artery disease Mother        CABG  . Heart disease Brother        "heart problems"  . Lupus Daughter     Social History   Socioeconomic History  . Marital status: Widowed    Spouse name: Not on file  . Number of children: Not on file  . Years of education: Not on file  . Highest education level: Not on file  Occupational History  . Occupation: retired     Comment: as Radio broadcast assistant for [employer]  . Smoking status: Former Smoker     Packs/day: 4.00    Years: 6.00    Pack years: 24.00    Types: Cigarettes    Quit date: 05/25/1960    Years since quitting: 59.5  . Smokeless tobacco: Never Used  . Tobacco comment: Quit over 46 years ago  Substance and Sexual Activity  . Alcohol use: No  . Drug use: Never  . Sexual activity: Not on file  Other Topics Concern  . Not on file  Social History Narrative  . Not on file   Social Determinants of Health   Financial Resource Strain:   . Difficulty of Paying Living Expenses:   Food Insecurity:   . Worried About Charity fundraiser in the Last Year:   . Arboriculturist in the Last Year:   Transportation Needs:   . Film/video editor (Medical):   Marland Kitchen Lack of Transportation (Non-Medical):   Physical Activity:   . Days of Exercise per Week:   . Minutes of Exercise per Session:   Stress:   . Feeling of Stress :   Social Connections:   . Frequency of Communication with Friends and Family:   . Frequency of Social Gatherings with Friends and Family:   . Attends Religious Services:   . Active Member of Clubs or Organizations:   . Attends Archivist Meetings:   Marland Kitchen Marital Status:   Intimate Partner Violence:   . Fear of Current or Ex-Partner:   . Emotionally Abused:   Marland Kitchen Physically Abused:   . Sexually Abused:     Review of Systems: See HPI, otherwise negative ROS  Physical Exam: BP (!) 180/85   Pulse 100   Temp (!) 96.1 F (35.6 C) (Temporal)   Resp 16   Ht 5\' 6"  (1.676 m)   Wt 65.9 kg   SpO2 100%   BMI 23.45 kg/m  General:   Alert,  pleasant and cooperative in NAD Head:  Normocephalic and atraumatic. Neck:  Supple; no masses or thyromegaly. Lungs:  Clear throughout to auscultation.    Heart:  Regular rate and rhythm. Abdomen:  Soft, nontender and nondistended. Normal bowel sounds, without guarding, and without rebound.   Neurologic:  Alert and  oriented x4;  grossly normal neurologically.  Impression/Plan: Breanna Hodge is here for  an colonoscopy to be performed for personal h/o colon polyps  Risks, benefits, limitations, and alternatives regarding  colonoscopy have been reviewed with the patient.  Questions have been answered.  All parties agreeable.   Sherri Sear, MD  12/12/2019, 9:09 AM

## 2019-12-12 NOTE — Transfer of Care (Signed)
Immediate Anesthesia Transfer of Care Note  Patient: Breanna Hodge  Procedure(s) Performed: COLONOSCOPY WITH PROPOFOL (N/A )  Patient Location: PACU  Anesthesia Type:General  Level of Consciousness: sedated  Airway & Oxygen Therapy: Patient Spontanous Breathing and Patient connected to nasal cannula oxygen  Post-op Assessment: Report given to RN and Post -op Vital signs reviewed and stable  Post vital signs: Reviewed and stable  Last Vitals:  Vitals Value Taken Time  BP 97/62 12/12/19 0953  Temp 36.6 C 12/12/19 0953  Pulse 77 12/12/19 0954  Resp 21 12/12/19 0954  SpO2 100 % 12/12/19 0954  Vitals shown include unvalidated device data.  Last Pain:  Vitals:   12/12/19 0953  TempSrc: Tympanic  PainSc: Asleep         Complications: No apparent anesthesia complications

## 2019-12-12 NOTE — Anesthesia Preprocedure Evaluation (Signed)
Anesthesia Evaluation  Patient identified by MRN, date of birth, ID band Patient awake    Reviewed: Allergy & Precautions, H&P , NPO status , Patient's Chart, lab work & pertinent test results, reviewed documented beta blocker date and time   History of Anesthesia Complications Negative for: history of anesthetic complications  Airway Mallampati: II  TM Distance: >3 FB Neck ROM: full    Dental  (+) Edentulous Upper, Edentulous Lower, Upper Dentures, Lower Dentures   Pulmonary neg pulmonary ROS, former smoker,    Pulmonary exam normal breath sounds clear to auscultation       Cardiovascular Exercise Tolerance: Good hypertension, (-) angina(-) Past MI and (-) Cardiac Stents Normal cardiovascular exam(-) dysrhythmias + Valvular Problems/Murmurs  Rhythm:regular Rate:Normal     Neuro/Psych negative neurological ROS  negative psych ROS   GI/Hepatic negative GI ROS, Neg liver ROS,   Endo/Other  negative endocrine ROS  Renal/GU negative Renal ROS  negative genitourinary   Musculoskeletal   Abdominal   Peds  Hematology negative hematology ROS (+)   Anesthesia Other Findings Past Medical History: No date: Anemia No date: Arthritis No date: Clotting disorder (HCC)     Comment:  DVT, RLE during pregnancy No date: DLE (discoid lupus erythematosus)     Comment:  diagnosed at [facility] by skin biopsy No date: DVT (deep venous thrombosis) (HCC)     Comment:  RLE, associated with pregnancy No date: H/O: Bell's palsy     Comment:  approx 15 yrs ago, resolved No date: Hyperlipidemia No date: Hypertension No date: Rash     Comment:  recurring on RLE, reported as discoid lupus by patient               per prior [facility] eval No date: Wears dentures     Comment:  Full upper and lower. Does NOT wear lower.   Reproductive/Obstetrics negative OB ROS                             Anesthesia Physical Anesthesia  Plan  ASA: II  Anesthesia Plan: General   Post-op Pain Management:    Induction: Intravenous  PONV Risk Score and Plan: 2 and Propofol infusion and TIVA  Airway Management Planned: Natural Airway and Nasal Cannula  Additional Equipment:   Intra-op Plan:   Post-operative Plan:   Informed Consent: I have reviewed the patients History and Physical, chart, labs and discussed the procedure including the risks, benefits and alternatives for the proposed anesthesia with the patient or authorized representative who has indicated his/her understanding and acceptance.     Dental Advisory Given  Plan Discussed with: Anesthesiologist, CRNA and Surgeon  Anesthesia Plan Comments:         Anesthesia Quick Evaluation

## 2019-12-12 NOTE — Op Note (Signed)
[facility] Patient Name: Brieana Shimmin Procedure Date: 12/12/2019 9:06 AM MRN: [id number] Account #: [id number] Date of Birth: 1940-10-12 Admit Type: Outpatient Age: 79 Room: [facility] ENDO ROOM 4 Gender: Female Note Status: Finalized Procedure:             Colonoscopy Indications:           High risk colon cancer surveillance: Personal history                         of colonic polyps Providers:             Lin Landsman MD, MD Medicines:             Monitored Anesthesia Care Complications:         No immediate complications. Estimated blood loss: None. Procedure:             Pre-Anesthesia Assessment:                        - Prior to the procedure, a History and Physical was                         performed, and patient medications and allergies were                         reviewed. The patient is competent. The risks and                         benefits of the procedure and the sedation options and                         risks were discussed with the patient. All questions                         were answered and informed consent was obtained.                         Patient identification and proposed procedure were                         verified by the physician, the nurse, the                         anesthesiologist, the anesthetist and the technician                         in the pre-procedure area in the procedure room in the                         endoscopy suite. Mental Status Examination: alert and                         oriented. Airway Examination: normal oropharyngeal                         airway and neck mobility. Respiratory Examination:                         clear to auscultation. CV Examination: normal.  Prophylactic Antibiotics: The patient does not require                         prophylactic antibiotics. Prior Anticoagulants: The                         patient has taken no previous  anticoagulant or                         antiplatelet agents. ASA Grade Assessment: II - A                         patient with mild systemic disease. After reviewing                         the risks and benefits, the patient was deemed in                         satisfactory condition to undergo the procedure. The                         anesthesia plan was to use monitored anesthesia care                         (MAC). Immediately prior to administration of                         medications, the patient was re-assessed for adequacy                         to receive sedatives. The heart rate, respiratory                         rate, oxygen saturations, blood pressure, adequacy of                         pulmonary ventilation, and response to care were                         monitored throughout the procedure. The physical                         status of the patient was re-assessed after the                         procedure.                        After obtaining informed consent, the colonoscope was                         passed under direct vision. Throughout the procedure,                         the patient's blood pressure, pulse, and oxygen                         saturations were monitored continuously. The was  introduced through the anus and advanced to the the                         terminal ileum, with identification of the appendiceal                         orifice and IC valve. The colonoscopy was performed                         with moderate difficulty due to multiple diverticula                         in the colon and significant looping. Successful                         completion of the procedure was aided by applying                         abdominal pressure. The patient tolerated the                         procedure well. The quality of the bowel preparation                         was evaluated using the BBPS [facility] Bowel  Preparation                         Scale) with scores of: Right Colon = 3, Transverse                         Colon = 3 and Left Colon = 3 (entire mucosa seen well                         with no residual staining, small fragments of stool or                         opaque liquid). The total BBPS score equals 9. Findings:      The perianal and digital rectal examinations were normal. Pertinent       negatives include normal sphincter tone and no palpable rectal lesions.      The terminal ileum contained a few scattered non-bleeding aphthae. No       stigmata of recent bleeding were seen. This was biopsied with a cold       forceps for histology.      Multiple diverticula were found in the sigmoid colon.      Non-bleeding external and internal hemorrhoids were found during       retroflexion. The hemorrhoids were moderate.      The exam was otherwise without abnormality. Impression:            - Aphtha in the terminal ileum. Biopsied.                        - Diverticulosis in the sigmoid colon.                        - Non-bleeding external and internal hemorrhoids.                        -  The examination was otherwise normal. Recommendation:        - Discharge patient to home (with escort).                        - Resume previous diet today.                        - Continue present medications.                        - Await pathology results.                        - Repeat colonoscopy in 10 years for surveillance. Procedure Code(s):     --- Professional ---                        [phone], Colonoscopy, flexible; with biopsy, single or                         multiple Diagnosis Code(s):     --- Professional ---                        Z86.010, Personal history of colonic polyps                        K63.89, Other specified diseases of intestine                        K64.8, Other hemorrhoids                        K57.30, Diverticulosis of large intestine without                          perforation or abscess without bleeding CPT copyright 2019 American Medical Association. All rights reserved. The codes documented in this report are preliminary and upon coder review may  be revised to meet current compliance requirements. Dr. Ulyess Mort Lin Landsman MD, MD 12/12/2019 9:50:34 AM This report has been signed electronically. Number of Addenda: 0 Note Initiated On: 12/12/2019 9:06 AM Scope Withdrawal Time: 0 hours 13 minutes 41 seconds  Total Procedure Duration: 0 hours 20 minutes 39 seconds  Estimated Blood Loss:  Estimated blood loss: none.      [facility]

## 2019-12-13 ENCOUNTER — Encounter: Payer: Self-pay | Admitting: *Deleted

## 2019-12-13 NOTE — Telephone Encounter (Signed)
Pt is scheduled for 12/25/19 @ 10am. She can't have a cpe due to her insurance being medicare.

## 2019-12-13 NOTE — Telephone Encounter (Signed)
Called pt to schedule 2 week appt with Jerelene Redden but patient does not have a voicemail to leave a message.

## 2019-12-14 LAB — SURGICAL PATHOLOGY

## 2019-12-14 MED ORDER — SPIRONOLACTONE 25 MG PO TABS: 25.0000 mg | ORAL_TABLET | Freq: Every day | ORAL | 3 refills | Status: DC

## 2019-12-14 NOTE — Telephone Encounter (Signed)
Spoke with patient regarding results and recommendations.  Patient verbalizes understanding and is agreeable to plan of care. She states that she wants to hold off on getting an echocardiogram for now but will let us know in the next week or so if she would be agreeable to getting it done.  Order placed for spironolactone 25 mg daily.  Advised patient to call back with any issues or concerns.

## 2019-12-14 NOTE — Telephone Encounter (Signed)
Would add spironolactone 25 daily (low dose) This may help with her dropping potassium from HCTZ and help BP High dose is 50 daily

## 2019-12-17 ENCOUNTER — Telehealth: Payer: Self-pay

## 2019-12-17 NOTE — Addendum Note (Signed)
Addended by: Verlon Au on: 12/17/2019 02:22 PM   Modules accepted: Orders

## 2019-12-17 NOTE — Telephone Encounter (Signed)
Echo ordered per pt request.  Routing to scheduling.

## 2019-12-17 NOTE — Telephone Encounter (Signed)
Tried to call patient but voicemail is not set up  

## 2019-12-17 NOTE — Telephone Encounter (Signed)
Patient calling Would like to discuss possibly getting ECHO appt  Please call to discuss

## 2019-12-17 NOTE — Telephone Encounter (Signed)
-----   Message from Lin Landsman, MD sent at 12/17/2019  4:46 PM EDT ----- Inform patient that the biopsies did not reveal any evidence of ulcerative colitis or Crohn's disease.  She can try Imodium or Bentyl 10mg  TID as needed for abdominal pain and diarrhea.  I will see her for follow-up as scheduled  Rohini Vanga

## 2019-12-18 ENCOUNTER — Ambulatory Visit: Payer: Medicare Other | Attending: Internal Medicine

## 2019-12-18 DIAGNOSIS — Z23 Encounter for immunization: Secondary | ICD-10-CM

## 2019-12-18 NOTE — Progress Notes (Signed)
   Covid-19 Vaccination Clinic  Name:  Breanna Hodge    MRN: [id number] DOB: 21-Apr-1941  12/18/2019  [name] was observed post Covid-19 immunization for 15 minutes without incident. She was provided with Vaccine Information Sheet and instruction to access the V-Safe system.   [name] was instructed to call 911 with any severe reactions post vaccine: Marland Kitchen Difficulty breathing  . Swelling of face and throat  . A fast heartbeat  . A bad rash all over body  . Dizziness and weakness   Immunizations Administered    Name Date Dose VIS Date Route   Moderna COVID-19 Vaccine 12/18/2019  9:51 AM 0.5 mL 08/28/2019 Intramuscular   Manufacturer: Levan Hurst   LotUT:[id number]   RalstonPO:[id number]

## 2019-12-18 NOTE — Telephone Encounter (Signed)
Patient verbalized understanding of results  

## 2019-12-25 ENCOUNTER — Ambulatory Visit: Payer: Medicare Other | Admitting: Nurse Practitioner

## 2020-01-01 ENCOUNTER — Encounter: Payer: Self-pay | Admitting: Nurse Practitioner

## 2020-01-01 ENCOUNTER — Other Ambulatory Visit: Payer: Self-pay

## 2020-01-01 ENCOUNTER — Ambulatory Visit: Payer: Medicare Other | Admitting: Nurse Practitioner

## 2020-01-01 VITALS — BP 142/70 | HR 70 | Temp 97.4°F | Resp 16 | Ht 66.0 in | Wt 146.4 lb

## 2020-01-01 DIAGNOSIS — I10 Essential (primary) hypertension: Secondary | ICD-10-CM | POA: Diagnosis not present

## 2020-01-01 DIAGNOSIS — E559 Vitamin D deficiency, unspecified: Secondary | ICD-10-CM

## 2020-01-01 DIAGNOSIS — R413 Other amnesia: Secondary | ICD-10-CM | POA: Diagnosis not present

## 2020-01-01 DIAGNOSIS — Z780 Asymptomatic menopausal state: Secondary | ICD-10-CM

## 2020-01-01 DIAGNOSIS — Z13820 Encounter for screening for osteoporosis: Secondary | ICD-10-CM

## 2020-01-01 DIAGNOSIS — Z8639 Personal history of other endocrine, nutritional and metabolic disease: Secondary | ICD-10-CM | POA: Diagnosis not present

## 2020-01-01 LAB — BASIC METABOLIC PANEL
BUN: 18 mg/dL (ref 6–23)
CO2: 27 mEq/L (ref 19–32)
Calcium: 10.2 mg/dL (ref 8.4–10.5)
Chloride: 98 mEq/L (ref 96–112)
Creatinine, Ser: 0.55 mg/dL (ref 0.40–1.20)
GFR: 106.35 mL/min (ref >60.00)
Glucose, Bld: 92 mg/dL (ref 70–99)
Potassium: 3.6 mEq/L (ref 3.5–5.1)
Sodium: 135 mEq/L (ref 135–145)

## 2020-01-01 LAB — B12 AND FOLATE PANEL
Folate: >23.7 ng/mL (ref >5.9)
Vitamin B-12: 431 pg/mL (ref 211–911)

## 2020-01-01 LAB — VITAMIN D 25 HYDROXY (VIT D DEFICIENCY, FRACTURES): VITD: 76.64 ng/mL (ref 30.00–100.00)

## 2020-01-01 NOTE — Patient Instructions (Addendum)
Continue with your lisinopril/HCTZ for BP and please ask Dr. Bethanne Ginger group about the aldactone that you are not taking.   Your BP is in fair control. I hat to push too much as your are in goal some of the time at home readings.   Please go to the lab today.   I will order a Dexa when I can get a diagnosis for it. Will check your Vit D  and calcium levels.   For memory changes- I have added B12 and Hemoglobin checks which yo have not had done in a long time.  Please see me back in 3 mos and keep a BP record at home.  DASH Eating Plan DASH stands for "Dietary Approaches to Stop Hypertension." The DASH eating plan is a healthy eating plan that has been shown to reduce high blood pressure (hypertension). It may also reduce your risk for type 2 diabetes, heart disease, and stroke. The DASH eating plan may also help with weight loss. What are tips for following this plan?  General guidelines  Avoid eating more than 2,300 mg (milligrams) of salt (sodium) a day. If you have hypertension, you may need to reduce your sodium intake to 1,500 mg a day.  Limit alcohol intake to no more than 1 drink a day for nonpregnant women and 2 drinks a day for men. One drink equals 12 oz of beer, 5 oz of wine, or 1 oz of hard liquor.  Work with your health care provider to maintain a healthy body weight or to lose weight. Ask what an ideal weight is for you.  Get at least 30 minutes of exercise that causes your heart to beat faster (aerobic exercise) most days of the week. Activities may include walking, swimming, or biking.  Work with your health care provider or diet and nutrition specialist (dietitian) to adjust your eating plan to your individual calorie needs. Reading food labels   Check food labels for the amount of sodium per serving. Choose foods with less than 5 percent of the Daily Value of sodium. Generally, foods with less than 300 mg of sodium per serving fit into this eating plan.  To find whole  grains, look for the word "whole" as the first word in the ingredient list. Shopping  Buy products labeled as "low-sodium" or "no salt added."  Buy fresh foods. Avoid canned foods and premade or frozen meals. Cooking  Avoid adding salt when cooking. Use salt-free seasonings or herbs instead of table salt or sea salt. Check with your health care provider or pharmacist before using salt substitutes.  Do not fry foods. Cook foods using healthy methods such as baking, boiling, grilling, and broiling instead.  Cook with heart-healthy oils, such as olive, canola, soybean, or sunflower oil. Meal planning  Eat a balanced diet that includes: ? 5 or more servings of fruits and vegetables each day. At each meal, try to fill half of your plate with fruits and vegetables. ? Up to 6-8 servings of whole grains each day. ? Less than 6 oz of lean meat, poultry, or fish each day. A 3-oz serving of meat is about the same size as a deck of cards. One egg equals 1 oz. ? 2 servings of low-fat dairy each day. ? A serving of nuts, seeds, or beans 5 times each week. ? Heart-healthy fats. Healthy fats called Omega-3 fatty acids are found in foods such as flaxseeds and coldwater fish, like sardines, salmon, and mackerel.  Limit how much you  eat of the following: ? Canned or prepackaged foods. ? Food that is high in trans fat, such as fried foods. ? Food that is high in saturated fat, such as fatty meat. ? Sweets, desserts, sugary drinks, and other foods with added sugar. ? Full-fat dairy products.  Do not salt foods before eating.  Try to eat at least 2 vegetarian meals each week.  Eat more home-cooked food and less restaurant, buffet, and fast food.  When eating at a restaurant, ask that your food be prepared with less salt or no salt, if possible. What foods are recommended? The items listed may not be a complete list. Talk with your dietitian about what dietary choices are best for  you. Grains Whole-grain or whole-wheat bread. Whole-grain or whole-wheat pasta. Brown rice. [place] Morrow. Bulgur. Whole-grain and low-sodium cereals. Pita bread. Low-fat, low-sodium crackers. Whole-wheat flour tortillas. Vegetables Fresh or frozen vegetables (raw, steamed, roasted, or grilled). Low-sodium or reduced-sodium tomato and vegetable juice. Low-sodium or reduced-sodium tomato sauce and tomato paste. Low-sodium or reduced-sodium canned vegetables. Fruits All fresh, dried, or frozen fruit. Canned fruit in natural juice (without added sugar). Meat and other protein foods Skinless chicken or [place]. Ground chicken or [place]. Pork with fat trimmed off. Fish and seafood. Egg whites. Dried beans, peas, or lentils. Unsalted nuts, nut butters, and seeds. Unsalted canned beans. Lean cuts of beef with fat trimmed off. Low-sodium, lean deli meat. Dairy Low-fat (1%) or fat-free (skim) milk. Fat-free, low-fat, or reduced-fat cheeses. Nonfat, low-sodium ricotta or cottage cheese. Low-fat or nonfat yogurt. Low-fat, low-sodium cheese. Fats and oils Soft margarine without trans fats. Vegetable oil. Low-fat, reduced-fat, or light mayonnaise and salad dressings (reduced-sodium). Canola, safflower, olive, soybean, and sunflower oils. Avocado. Seasoning and other foods Herbs. Spices. Seasoning mixes without salt. Unsalted popcorn and pretzels. Fat-free sweets. What foods are not recommended? The items listed may not be a complete list. Talk with your dietitian about what dietary choices are best for you. Grains Baked goods made with fat, such as croissants, muffins, or some breads. Dry pasta or rice meal packs. Vegetables Creamed or fried vegetables. Vegetables in a cheese sauce. Regular canned vegetables (not low-sodium or reduced-sodium). Regular canned tomato sauce and paste (not low-sodium or reduced-sodium). Regular tomato and vegetable juice (not low-sodium or reduced-sodium). Angie Fava.  Olives. Fruits Canned fruit in a light or heavy syrup. Fried fruit. Fruit in cream or butter sauce. Meat and other protein foods Fatty cuts of meat. Ribs. Fried meat. Berniece Salines. Sausage. Bologna and other processed lunch meats. Salami. Fatback. Hotdogs. Bratwurst. Salted nuts and seeds. Canned beans with added salt. Canned or smoked fish. Whole eggs or egg yolks. Chicken or [place] with skin. Dairy Whole or 2% milk, cream, and half-and-half. Whole or full-fat cream cheese. Whole-fat or sweetened yogurt. Full-fat cheese. Nondairy creamers. Whipped toppings. Processed cheese and cheese spreads. Fats and oils Butter. Stick margarine. Lard. Shortening. Ghee. Bacon fat. Tropical oils, such as coconut, palm kernel, or palm oil. Seasoning and other foods Salted popcorn and pretzels. Onion salt, garlic salt, seasoned salt, table salt, and sea salt. Worcestershire sauce. Tartar sauce. Barbecue sauce. Teriyaki sauce. Soy sauce, including reduced-sodium. Steak sauce. Canned and packaged gravies. Fish sauce. Oyster sauce. Cocktail sauce. Horseradish that you find on the shelf. Ketchup. Mustard. Meat flavorings and tenderizers. Bouillon cubes. Hot sauce and Tabasco sauce. Premade or packaged marinades. Premade or packaged taco seasonings. Relishes. Regular salad dressings. Where to find more information:  National Heart, Lung, and Blood Institute: https://wilson-eaton.com/  American  Heart Association: www.heart.org Summary  The DASH eating plan is a healthy eating plan that has been shown to reduce high blood pressure (hypertension). It may also reduce your risk for type 2 diabetes, heart disease, and stroke.  With the DASH eating plan, you should limit salt (sodium) intake to 2,300 mg a day. If you have hypertension, you may need to reduce your sodium intake to 1,500 mg a day.  When on the DASH eating plan, aim to eat more fresh fruits and vegetables, whole grains, lean proteins, low-fat dairy, and heart-healthy  fats.  Work with your health care provider or diet and nutrition specialist (dietitian) to adjust your eating plan to your individual calorie needs. This information is not intended to replace advice given to you by your health care provider. Make sure you discuss any questions you have with your health care provider. Document Revised: 08/26/2017 Document Reviewed: 09/06/2016 Elsevier Patient Education  2020 [employer].  Hypertension, Adult Hypertension is another name for high blood pressure. High blood pressure forces your heart to work harder to pump blood. This can cause problems over time. There are two numbers in a blood pressure reading. There is a top number (systolic) over a bottom number (diastolic). It is best to have a blood pressure that is below 120/80. Healthy choices can help lower your blood pressure, or you may need medicine to help lower it. What are the causes? The cause of this condition is not known. Some conditions may be related to high blood pressure. What increases the risk?  Smoking.  Having type 2 diabetes mellitus, high cholesterol, or both.  Not getting enough exercise or physical activity.  Being overweight.  Having too much fat, sugar, calories, or salt (sodium) in your diet.  Drinking too much alcohol.  Having long-term (chronic) kidney disease.  Having a family history of high blood pressure.  Age. Risk increases with age.  Race. You may be at higher risk if you are African American.  Gender. Men are at higher risk than women before age 21. After age 7, women are at higher risk than men.  Having obstructive sleep apnea.  Stress. What are the signs or symptoms?  High blood pressure may not cause symptoms. Very high blood pressure (hypertensive crisis) may cause: ? Headache. ? Feelings of worry or nervousness (anxiety). ? Shortness of breath. ? Nosebleed. ? A feeling of being sick to your stomach (nausea). ? Throwing up  (vomiting). ? Changes in how you see. ? Very bad chest pain. ? Seizures. How is this treated?  This condition is treated by making healthy lifestyle changes, such as: ? Eating healthy foods. ? Exercising more. ? Drinking less alcohol.  Your health care provider may prescribe medicine if lifestyle changes are not enough to get your blood pressure under control, and if: ? Your top number is above 130. ? Your bottom number is above 80.  Your personal target blood pressure may vary. Follow these instructions at home: Eating and drinking   If told, follow the DASH eating plan. To follow this plan: ? Fill one half of your plate at each meal with fruits and vegetables. ? Fill one fourth of your plate at each meal with whole grains. Whole grains include whole-wheat pasta, brown rice, and whole-grain bread. ? Eat or drink low-fat dairy products, such as skim milk or low-fat yogurt. ? Fill one fourth of your plate at each meal with low-fat (lean) proteins. Low-fat proteins include fish, chicken without skin, eggs,  beans, and tofu. ? Avoid fatty meat, cured and processed meat, or chicken with skin. ? Avoid pre-made or processed food.  Eat less than 1,500 mg of salt each day.  Do not drink alcohol if: ? Your doctor tells you not to drink. ? You are pregnant, may be pregnant, or are planning to become pregnant.  If you drink alcohol: ? Limit how much you use to:  0-1 drink a day for women.  0-2 drinks a day for men. ? Be aware of how much alcohol is in your drink. In the [place] one drink equals one 12 oz bottle of beer (355 mL), one 5 oz glass of wine (148 mL), or one 1 oz glass of hard liquor (44 mL). Lifestyle   Work with your doctor to stay at a healthy weight or to lose weight. Ask your doctor what the best weight is for you.  Get at least 30 minutes of exercise most days of the week. This may include walking, swimming, or biking.  Get at least 30 minutes of exercise that  strengthens your muscles (resistance exercise) at least 3 days a week. This may include lifting weights or doing Pilates.  Do not use any products that contain nicotine or tobacco, such as cigarettes, e-cigarettes, and chewing tobacco. If you need help quitting, ask your doctor.  Check your blood pressure at home as told by your doctor.  Keep all follow-up visits as told by your doctor. This is important. Medicines  Take over-the-counter and prescription medicines only as told by your doctor. Follow directions carefully.  Do not skip doses of blood pressure medicine. The medicine does not work as well if you skip doses. Skipping doses also puts you at risk for problems.  Ask your doctor about side effects or reactions to medicines that you should watch for. Contact a doctor if you:  Think you are having a reaction to the medicine you are taking.  Have headaches that keep coming back (recurring).  Feel dizzy.  Have swelling in your ankles.  Have trouble with your vision. Get help right away if you:  Get a very bad headache.  Start to feel mixed up (confused).  Feel weak or numb.  Feel faint.  Have very bad pain in your: ? Chest. ? Belly (abdomen).  Throw up more than once.  Have trouble breathing. Summary  Hypertension is another name for high blood pressure.  High blood pressure forces your heart to work harder to pump blood.  For most people, a normal blood pressure is less than 120/80.  Making healthy choices can help lower blood pressure. If your blood pressure does not get lower with healthy choices, you may need to take medicine. This information is not intended to replace advice given to you by your health care provider. Make sure you discuss any questions you have with your health care provider. Document Revised: 05/24/2018 Document Reviewed: 05/24/2018 Elsevier Patient Education  [place] DASH stands for "Dietary Approaches  to Stop Hypertension." The DASH eating plan is a healthy eating plan that has been shown to reduce high blood pressure (hypertension). It may also reduce your risk for type 2 diabetes, heart disease, and stroke. The DASH eating plan may also help with weight loss. What are tips for following this plan?  General guidelines  Avoid eating more than 2,300 mg (milligrams) of salt (sodium) a day. If you have hypertension, you may need to reduce your sodium intake to  1,500 mg a day.  Limit alcohol intake to no more than 1 drink a day for nonpregnant women and 2 drinks a day for men. One drink equals 12 oz of beer, 5 oz of wine, or 1 oz of hard liquor.  Work with your health care provider to maintain a healthy body weight or to lose weight. Ask what an ideal weight is for you.  Get at least 30 minutes of exercise that causes your heart to beat faster (aerobic exercise) most days of the week. Activities may include walking, swimming, or biking.  Work with your health care provider or diet and nutrition specialist (dietitian) to adjust your eating plan to your individual calorie needs. Reading food labels   Check food labels for the amount of sodium per serving. Choose foods with less than 5 percent of the Daily Value of sodium. Generally, foods with less than 300 mg of sodium per serving fit into this eating plan.  To find whole grains, look for the word "whole" as the first word in the ingredient list. Shopping  Buy products labeled as "low-sodium" or "no salt added."  Buy fresh foods. Avoid canned foods and premade or frozen meals. Cooking  Avoid adding salt when cooking. Use salt-free seasonings or herbs instead of table salt or sea salt. Check with your health care provider or pharmacist before using salt substitutes.  Do not fry foods. Cook foods using healthy methods such as baking, boiling, grilling, and broiling instead.  Cook with heart-healthy oils, such as olive, canola, soybean, or  sunflower oil. Meal planning  Eat a balanced diet that includes: ? 5 or more servings of fruits and vegetables each day. At each meal, try to fill half of your plate with fruits and vegetables. ? Up to 6-8 servings of whole grains each day. ? Less than 6 oz of lean meat, poultry, or fish each day. A 3-oz serving of meat is about the same size as a deck of cards. One egg equals 1 oz. ? 2 servings of low-fat dairy each day. ? A serving of nuts, seeds, or beans 5 times each week. ? Heart-healthy fats. Healthy fats called Omega-3 fatty acids are found in foods such as flaxseeds and coldwater fish, like sardines, salmon, and mackerel.  Limit how much you eat of the following: ? Canned or prepackaged foods. ? Food that is high in trans fat, such as fried foods. ? Food that is high in saturated fat, such as fatty meat. ? Sweets, desserts, sugary drinks, and other foods with added sugar. ? Full-fat dairy products.  Do not salt foods before eating.  Try to eat at least 2 vegetarian meals each week.  Eat more home-cooked food and less restaurant, buffet, and fast food.  When eating at a restaurant, ask that your food be prepared with less salt or no salt, if possible. What foods are recommended? The items listed may not be a complete list. Talk with your dietitian about what dietary choices are best for you. Grains Whole-grain or whole-wheat bread. Whole-grain or whole-wheat pasta. Brown rice. [place]. Bulgur. Whole-grain and low-sodium cereals. Pita bread. Low-fat, low-sodium crackers. Whole-wheat flour tortillas. Vegetables Fresh or frozen vegetables (raw, steamed, roasted, or grilled). Low-sodium or reduced-sodium tomato and vegetable juice. Low-sodium or reduced-sodium tomato sauce and tomato paste. Low-sodium or reduced-sodium canned vegetables. Fruits All fresh, dried, or frozen fruit. Canned fruit in natural juice (without added sugar). Meat and other protein foods Skinless  chicken or [place]. Ground chicken  or [place]. Pork with fat trimmed off. Fish and seafood. Egg whites. Dried beans, peas, or lentils. Unsalted nuts, nut butters, and seeds. Unsalted canned beans. Lean cuts of beef with fat trimmed off. Low-sodium, lean deli meat. Dairy Low-fat (1%) or fat-free (skim) milk. Fat-free, low-fat, or reduced-fat cheeses. Nonfat, low-sodium ricotta or cottage cheese. Low-fat or nonfat yogurt. Low-fat, low-sodium cheese. Fats and oils Soft margarine without trans fats. Vegetable oil. Low-fat, reduced-fat, or light mayonnaise and salad dressings (reduced-sodium). Canola, safflower, olive, soybean, and sunflower oils. Avocado. Seasoning and other foods Herbs. Spices. Seasoning mixes without salt. Unsalted popcorn and pretzels. Fat-free sweets. What foods are not recommended? The items listed may not be a complete list. Talk with your dietitian about what dietary choices are best for you. Grains Baked goods made with fat, such as croissants, muffins, or some breads. Dry pasta or rice meal packs. Vegetables Creamed or fried vegetables. Vegetables in a cheese sauce. Regular canned vegetables (not low-sodium or reduced-sodium). Regular canned tomato sauce and paste (not low-sodium or reduced-sodium). Regular tomato and vegetable juice (not low-sodium or reduced-sodium). Angie Fava. Olives. Fruits Canned fruit in a light or heavy syrup. Fried fruit. Fruit in cream or butter sauce. Meat and other protein foods Fatty cuts of meat. Ribs. Fried meat. Berniece Salines. Sausage. Bologna and other processed lunch meats. Salami. Fatback. Hotdogs. Bratwurst. Salted nuts and seeds. Canned beans with added salt. Canned or smoked fish. Whole eggs or egg yolks. Chicken or [place] with skin. Dairy Whole or 2% milk, cream, and half-and-half. Whole or full-fat cream cheese. Whole-fat or sweetened yogurt. Full-fat cheese. Nondairy creamers. Whipped toppings. Processed cheese and cheese spreads. Fats and  oils Butter. Stick margarine. Lard. Shortening. Ghee. Bacon fat. Tropical oils, such as coconut, palm kernel, or palm oil. Seasoning and other foods Salted popcorn and pretzels. Onion salt, garlic salt, seasoned salt, table salt, and sea salt. Worcestershire sauce. Tartar sauce. Barbecue sauce. Teriyaki sauce. Soy sauce, including reduced-sodium. Steak sauce. Canned and packaged gravies. Fish sauce. Oyster sauce. Cocktail sauce. Horseradish that you find on the shelf. Ketchup. Mustard. Meat flavorings and tenderizers. Bouillon cubes. Hot sauce and Tabasco sauce. Premade or packaged marinades. Premade or packaged taco seasonings. Relishes. Regular salad dressings. Where to find more information:  National Heart, Lung, and [place]: https://wilson-eaton.com/  American Heart Association: www.heart.org Summary  The DASH eating plan is a healthy eating plan that has been shown to reduce high blood pressure (hypertension). It may also reduce your risk for type 2 diabetes, heart disease, and stroke.  With the DASH eating plan, you should limit salt (sodium) intake to 2,300 mg a day. If you have hypertension, you may need to reduce your sodium intake to 1,500 mg a day.  When on the DASH eating plan, aim to eat more fresh fruits and vegetables, whole grains, lean proteins, low-fat dairy, and heart-healthy fats.  Work with your health care provider or diet and nutrition specialist (dietitian) to adjust your eating plan to your individual calorie needs. This information is not intended to replace advice given to you by your health care provider. Make sure you discuss any questions you have with your health care provider. Document Revised: 08/26/2017 Document Reviewed: 09/06/2016 Elsevier Patient Education  2020 [employer].

## 2020-01-01 NOTE — Progress Notes (Signed)
Established Patient Office Visit  Subjective:  Patient ID: Breanna Hodge, female    DOB: Aug 18, 1941  Age: 79 y.o. MRN: [id number]  CC:  Chief Complaint  Patient presents with  . Follow-up    HPI EMALIA HERTEL presents for HTN and HLD, pre diabetes.    HTN: Patient is taking her lisinopril/HCTZ 20-25 on a daily basis. She is to continue to eat a healthy low-salt DASH diet. BP at home: 137/79. Her BP today was 121/67- at home after walking a mile and loading truck at food bank. Very active lifestyle for 79 yo.  No chest pain, palpitations, dizziness lightheadedness.    BP Readings from Last 3 Encounters:  01/01/20 (!) 142/70  12/12/19 (!) 155/71  11/27/19 133/79   Hyperlipidemia: Patient remains on Lipitor 10 mg, fish oil without any complaints.  She has improvement in her lipid panel, and normal liver enzymes. She denies any problems with muscle pain or aches, liver concerns, and seems to be tolerating the medication well.  Lab Results  Component Value Date   CHOL 171 11/27/2019   HDL 62.70 11/27/2019   LDLCALC 90 11/27/2019   LDLDIRECT 189.9 05/09/2012   TRIG 93.0 11/27/2019   CHOLHDL 3 11/27/2019   Slight elevation in glucose with A1c in the prediabetes range: To continue with lifestyle management at this time.  A1c is good.   Lab Results  Component Value Date   HGBA1C 6.0 11/27/2019   History of VIT D DEF:  Low Vit D and advised to take Vit D  supplements 10 years ago. Her most recent vit D level was in 2013. She remains taking the Vit D. No hx of DEXA.  She was told to take calcium in the past and her calcium level was increased and she was told to stop calcium.  Then, later  she was told to go back on calcium.  Current lab  today shows serum calcium slightly elevated at 10.6.  She is taking calcium supplements daily.   Past Medical History:  Diagnosis Date  . Anemia   . Arthritis   . Clotting disorder (HCC)    DVT, RLE during pregnancy  . DLE  (discoid lupus erythematosus)    diagnosed at [facility] by skin biopsy  . DVT (deep venous thrombosis) (HCC)    RLE, associated with pregnancy  . H/O: Bell's palsy    approx 15 yrs ago, resolved  . Hyperlipidemia   . Hypertension   . Rash    recurring on RLE, reported as discoid lupus by patient per prior [facility] eval  . Wears dentures    Full upper and lower. Does NOT wear lower.    Past Surgical History:  Procedure Laterality Date  . ABDOMINAL HYSTERECTOMY  1985   with bladder tack using mesh  . CATARACT EXTRACTION W/PHACO Left 07/14/2015   Procedure: CATARACT EXTRACTION PHACO AND INTRAOCULAR LENS PLACEMENT (IOC);  Surgeon: Ronnell Freshwater, MD;  Location: [facility];  Service: Ophthalmology;  Laterality: Left;  PER DR OFFICE PT WANTS EARLY AS POSSIBLE  . CATARACT EXTRACTION W/PHACO Right 08/25/2015   Procedure: CATARACT EXTRACTION PHACO AND INTRAOCULAR LENS PLACEMENT (IOC);  Surgeon: Ronnell Freshwater, MD;  Location: [place];  Service: Ophthalmology;  Laterality: Right;  PER DR OFFICE PT WANTS AS EARLY AS POSSIBLE  . CHOLECYSTECTOMY  1987  . COLONOSCOPY WITH PROPOFOL N/A 12/12/2019   Procedure: COLONOSCOPY WITH PROPOFOL;  Surgeon: Lin Landsman, MD;  Location: [facility] ENDOSCOPY;  Service: Gastroenterology;  Laterality: N/A;  . EYE SURGERY    . gyn surgery  1989   hysterectomy  . KNEE ARTHROSCOPY  2009   right  . KNEE ARTHROSCOPY  2007   right knee  . vein removal  2019   Right leg    Family History  Problem Relation Age of Onset  . Cancer Father        leukemia  . Coronary artery disease Mother        CABG  . Heart disease Brother        "heart problems"  . Lupus Daughter     Social History   Socioeconomic History  . Marital status: Widowed    Spouse name: Not on file  . Number of children: Not on file  . Years of education: Not on file  . Highest education level: Not on file  Occupational History  . Occupation: retired      Comment: as Radio broadcast assistant for [employer]  . Smoking status: Former Smoker    Packs/day: 4.00    Years: 6.00    Pack years: 24.00    Types: Cigarettes    Quit date: 05/25/1960    Years since quitting: 59.6  . Smokeless tobacco: Never Used  . Tobacco comment: Quit over 46 years ago  Substance and Sexual Activity  . Alcohol use: No  . Drug use: Never  . Sexual activity: Not on file  Other Topics Concern  . Not on file  Social History Narrative  . Not on file   Social Determinants of Health   Financial Resource Strain:   . Difficulty of Paying Living Expenses:   Food Insecurity:   . Worried About Charity fundraiser in the Last Year:   . Arboriculturist in the Last Year:   Transportation Needs:   . Film/video editor (Medical):   Marland Kitchen Lack of Transportation (Non-Medical):   Physical Activity:   . Days of Exercise per Week:   . Minutes of Exercise per Session:   Stress:   . Feeling of Stress :   Social Connections:   . Frequency of Communication with Friends and Family:   . Frequency of Social Gatherings with Friends and Family:   . Attends Religious Services:   . Active Member of Clubs or Organizations:   . Attends Archivist Meetings:   Marland Kitchen Marital Status:   Intimate Partner Violence:   . Fear of Current or Ex-Partner:   . Emotionally Abused:   Marland Kitchen Physically Abused:   . Sexually Abused:     Outpatient Medications Prior to Visit  Medication Sig Dispense Refill  . atorvastatin (LIPITOR) 10 MG tablet Take 10 mg by mouth daily.    . B Complex Vitamins (VITAMIN B COMPLEX) TABS Take 1 tablet by mouth daily.    Marland Kitchen BLACK ELDERBERRY PO Take 1 capsule by mouth daily.    . [place]-Glucosamine-Vit D (OSTEO BI-FLEX ONE PER DAY PO) Take 1 tablet by mouth daily.    . calcium-vitamin D (OSCAL 500/200 D-3) 500-200 MG-UNIT per tablet Take 2 tablets by mouth daily.     . Cholecalciferol (VITAMIN D3) 1000 UNITS CAPS Take 1 capsule by mouth daily.    . Coenzyme Q10  (COQ-10) 200 MG CAPS 100 mg. Take one by mouth daily     . lisinopril-hydrochlorothiazide (ZESTORETIC) 20-25 MG tablet Take 1 tablet by mouth daily.    . Magnesium 250 MG TABS Take by mouth.    Marland Kitchen  Multiple Vitamin (MULTIVITAMIN) tablet Take 1 tablet by mouth daily.    . Omega-3 Fatty Acids (FISH OIL) 1000 MG CAPS Take by mouth.    . Probiotic Product (PROBIOTIC FORMULA PO) Take by mouth.    . Red Yeast Rice 600 MG CAPS Take 2 capsules by mouth daily.      Marland Kitchen RESVERATROL 100 MG CAPS Take by mouth.    . spironolactone (ALDACTONE) 25 MG tablet Take 1 tablet (25 mg total) by mouth daily. 90 tablet 3  . vitamin C (ASCORBIC ACID) 250 MG tablet Take 500 mg by mouth daily.    . Zinc 50 MG CAPS Take by mouth daily.     No facility-administered medications prior to visit.    Allergies  Allergen Reactions  . Naprosyn [Naproxen] Hives and Shortness Of Breath  . Rosuvastatin Calcium Other (See Comments)    Pt states loss of use on rt side    . Atorvastatin Diarrhea and Nausea Only    Low bp  . Gluten Meal Itching  . Niacin And Related Rash   Review of Systems  Constitutional: Negative.   HENT: Negative.   Eyes: Negative.   Respiratory: Negative.   Cardiovascular: Negative.   Gastrointestinal:       Had her colonoscopy for diarrhea  and will get results at next appt.   Genitourinary: Negative.   Musculoskeletal: Negative.   Skin: Negative.   Neurological: Negative.   Psychiatric/Behavioral: Negative.       Objective:    Physical Exam  Constitutional: She is oriented to person, place, and time. She appears well-developed and well-nourished.  HENT:  Head: Normocephalic and atraumatic.  Eyes: Pupils are equal, round, and reactive to light.  Cardiovascular: Normal rate and regular rhythm.  Pulmonary/Chest: Effort normal and breath sounds normal.  Abdominal: Soft. There is no abdominal tenderness.  Musculoskeletal:        General: Normal range of motion.     Cervical back: Normal  range of motion and neck supple.  Neurological: She is alert and oriented to person, place, and time.  Skin: Skin is warm and dry.  Psychiatric: She has a normal mood and affect. Her behavior is normal.  She mentions memory change- several time today.Declines NEUROLOGY appt.     BP (!) 142/70 (BP Location: Left Arm, Patient Position: Sitting, Cuff Size: Normal)   Pulse 70   Temp (!) 97.4 F (36.3 C) (Temporal)   Resp 16   Ht 5\' 6"  (1.676 m)   Wt 146 lb 6.4 oz (66.4 kg)   SpO2 97%   BMI 23.63 kg/m  Wt Readings from Last 3 Encounters:  01/01/20 146 lb 6.4 oz (66.4 kg)  12/12/19 145 lb 4.5 oz (65.9 kg)  11/27/19 148 lb (67.1 kg)     Health Maintenance Due  Topic Date Due  . TETANUS/TDAP  Never done  . DEXA SCAN  Never done    There are no preventive care reminders to display for this patient.  Lab Results  Component Value Date   TSH 1.57 05/09/2012   Lab Results  Component Value Date   WBC 10.3 05/09/2012   HGB 14.6 05/09/2012   HCT 44.3 05/09/2012   MCV 93.7 05/09/2012   PLT 246.0 05/09/2012   Lab Results  Component Value Date   NA 135 01/01/2020   K 3.6 01/01/2020   CO2 27 01/01/2020   GLUCOSE 92 01/01/2020   BUN 18 01/01/2020   CREATININE 0.55 01/01/2020   BILITOT 0.3 11/27/2019  ALKPHOS 52 11/27/2019   AST 20 11/27/2019   ALT 14 11/27/2019   PROT 7.3 11/27/2019   ALBUMIN 4.1 11/27/2019   CALCIUM 10.2 01/01/2020   GFR 106.35 01/01/2020   Lab Results  Component Value Date   CHOL 171 11/27/2019   Lab Results  Component Value Date   HDL 62.70 11/27/2019   Lab Results  Component Value Date   LDLCALC 90 11/27/2019   Lab Results  Component Value Date   TRIG 93.0 11/27/2019   Lab Results  Component Value Date   CHOLHDL 3 11/27/2019   Lab Results  Component Value Date   HGBA1C 6.0 11/27/2019      Assessment & Plan:   Problem List Items Addressed This Visit      Cardiovascular and Mediastinum   Hypertension - Primary   Relevant  Orders   Basic Metabolic Panel (BMET) (Completed)    Other Visit Diagnoses    Screening for osteoporosis       Relevant Orders   Vitamin D (25 hydroxy) (Completed)   Memory changes       Relevant Orders   CBC With Differential   B12 and Folate Panel (Completed)   Vitamin D deficiency, unspecified        Relevant Orders   Vitamin D (25 hydroxy) (Completed)   History of vitamin D deficiency          No orders of the defined types were placed in this encounter.  Patient advised:  Continue with your lisinopril/HCTZ for BP and please ask Dr. Bethanne Ginger group about the aldactone that you are not taking.   Your BP is in fair control for her age and her home readings are better. Please go to the lab today.   I will order a Dexa when I can get a diagnosis for it. Will check your Vit D  and calcium levels. If calcium is still elevated, will need to hold oral calcium.   For memory changes- I have added B12 and Hemoglobin checks which yo have not had done in a long time  Please see me back in 3 mos and keep a BP record at home.   Follow-up: Return in about 3 months (around 04/01/2020).   This visit occurred during the SARS-CoV-2 public health emergency.  Safety protocols were in place, including screening questions prior to the visit, additional usage of staff PPE, and extensive cleaning of exam room while observing appropriate contact time as indicated for disinfecting solutions.    Denice Paradise, NP

## 2020-01-02 LAB — CBC WITH DIFFERENTIAL
Basophils Absolute: 0.1 10*3/uL (ref 0.0–0.2)
Basos: 1 %
EOS (ABSOLUTE): 0.3 10*3/uL (ref 0.0–0.4)
Eos: 4 %
Hematocrit: 42.4 % (ref 34.0–46.6)
Hemoglobin: 14.3 g/dL (ref 11.1–15.9)
Immature Grans (Abs): 0 10*3/uL (ref 0.0–0.1)
Immature Granulocytes: 0 %
Lymphocytes Absolute: 2.3 10*3/uL (ref 0.7–3.1)
Lymphs: 28 %
MCH: 31.4 pg (ref 26.6–33.0)
MCHC: 33.7 g/dL (ref 31.5–35.7)
MCV: 93 fL (ref 79–97)
Monocytes Absolute: 0.7 10*3/uL (ref 0.1–0.9)
Monocytes: 8 %
Neutrophils Absolute: 5 10*3/uL (ref 1.4–7.0)
Neutrophils: 59 %
RBC: 4.55 x10E6/uL (ref 3.77–5.28)
RDW: 12.3 % (ref 11.7–15.4)
WBC: 8.5 10*3/uL (ref 3.4–10.8)

## 2020-01-22 ENCOUNTER — Encounter: Payer: Self-pay | Admitting: Gastroenterology

## 2020-01-22 ENCOUNTER — Ambulatory Visit (INDEPENDENT_AMBULATORY_CARE_PROVIDER_SITE_OTHER): Payer: Medicare Other | Admitting: Gastroenterology

## 2020-01-22 ENCOUNTER — Other Ambulatory Visit: Payer: Self-pay

## 2020-01-22 VITALS — BP 147/74 | HR 95 | Temp 98.3°F | Wt 150.2 lb

## 2020-01-22 DIAGNOSIS — N814 Uterovaginal prolapse, unspecified: Secondary | ICD-10-CM

## 2020-01-22 NOTE — Progress Notes (Signed)
Cephas Darby, MD [address]  [place]  [place], [place] [address]  Main: [phone]  Fax: [phone]    Gastroenterology Consultation  Referring Provider:     No ref. provider found Primary Care Physician:  Marval Regal, NP Primary Gastroenterologist:  Dr. Cephas Darby Reason for Consultation:    History of diarrhea, uterine prolapse        HPI:   NYOMIE LOZO is a 79 y.o. female referred by Dr. Jerelene Redden, Janalyn Harder, NP  for consultation & management of history of diarrhea.  Patient reports that since October, she was experiencing 4-5 episodes of watery bowel movements and abdominal cramps after she had some [place] food.  This has recently subsided.  She was having diarrhea when the referral was placed.  She reports having 1-2 soft bowel movements daily.  Her weight has been stable.  She does report abdominal bloating which is her main concern today.  She has been taking probiotic which does not seem to be helping. She does wear panty liner due to concern for fecal incontinence.  She had 11 pregnancies, all were vaginal deliveries and she reports that this has led to difficulty controlling her bowels.  Therefore, has been waiting pantiliner for last several years..  She has 51 grandchildren.  She moved to [place] in 2019 from [place].  She reports having history of several polyps that were removed before moving to [place].  Review of her labs from 06/2019 revealed normal CBC, CMP  Patient was seen by Dr Rockey Situ on 11/06/2019 for abnormal EKG, work-up has been negative so far.  She underwent nuclear medicine stress test which did not reveal major perfusion abnormality concerning for major blockage. She does not smoke or drink alcohol  Follow-up visit 01/22/2020 Patient reports that her diarrhea has resolved. She underwent colonoscopy which revealed diverticulosis and aphthae in the terminal ileum. Immunostain for infectious etiology was negative, HSV and  CMV, fungal elements were negative. There was no evidence of chronicity of the terminal ileal aphthous ulcers. Her main concern today is her prolapse of her pelvic organs due to several pregnancies. She had 11 children in span of 19 years. 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  NSAIDs: None  Antiplts/Anticoagulants/Anti thrombotics: None  GI Procedures: Colonoscopy in 2019, multiple polyps removed  Colonoscopy 12/12/2019 - Aphtha in the terminal ileum. Biopsied. - Diverticulosis in the sigmoid colon. - Non-bleeding external and internal hemorrhoids. - The examination was otherwise normal.  DIAGNOSIS:  A. TERMINAL ILEUM, APHTHOUS ULCER; COLD BIOPSY:  - ACTIVELY INFLAMED ENTERIC MUCOSA WITH NORMAL VILLOUS ARCHITECTURE.  - NEGATIVE FOR MALIGNANCY.   Past Medical History:  Diagnosis Date  . Anemia   . Arthritis   . Clotting disorder (HCC)    DVT, RLE during pregnancy  . DLE (discoid lupus erythematosus)    diagnosed at [facility] by skin biopsy  . DVT (deep venous thrombosis) (HCC)    RLE, associated with pregnancy  . H/O: Bell's palsy    approx 15 yrs ago, resolved  . Hyperlipidemia   . Hypertension   . Rash    recurring on RLE, reported as discoid lupus by patient per prior [facility] eval  . Wears dentures    Full upper and lower. Does NOT wear lower.    Past Surgical History:  Procedure Laterality Date  . ABDOMINAL HYSTERECTOMY  1985   with bladder tack using mesh  . CATARACT EXTRACTION W/PHACO Left 07/14/2015  Procedure: CATARACT EXTRACTION PHACO AND INTRAOCULAR LENS PLACEMENT (IOC);  Surgeon: Ronnell Freshwater, MD;  Location: [facility];  Service: Ophthalmology;  Laterality: Left;  PER DR OFFICE PT WANTS EARLY AS POSSIBLE  . CATARACT EXTRACTION W/PHACO Right 08/25/2015   Procedure: CATARACT EXTRACTION PHACO AND INTRAOCULAR LENS PLACEMENT (IOC);  Surgeon: Ronnell Freshwater, MD;  Location: [facility];  Service: Ophthalmology;   Laterality: Right;  PER DR OFFICE PT WANTS AS EARLY AS POSSIBLE  . CHOLECYSTECTOMY  1987  . COLONOSCOPY WITH PROPOFOL N/A 12/12/2019   Procedure: COLONOSCOPY WITH PROPOFOL;  Surgeon: Lin Landsman, MD;  Location: [facility] ENDOSCOPY;  Service: Gastroenterology;  Laterality: N/A;  . EYE SURGERY    . gyn surgery  1989   hysterectomy  . KNEE ARTHROSCOPY  2009   right  . KNEE ARTHROSCOPY  2007   right knee  . vein removal  2019   Right leg    Current Outpatient Medications:  .  atorvastatin (LIPITOR) 10 MG tablet, Take 10 mg by mouth daily., Disp: , Rfl:  .  B Complex Vitamins (VITAMIN B COMPLEX) TABS, Take 1 tablet by mouth daily., Disp: , Rfl:  .  BLACK ELDERBERRY PO, Take 1 capsule by mouth daily., Disp: , Rfl:  .  Cholecalciferol (VITAMIN D3) 1000 UNITS CAPS, Take 1 capsule by mouth daily., Disp: , Rfl:  .  Coenzyme Q10 (COQ-10) 200 MG CAPS, 100 mg. Take one by mouth daily , Disp: , Rfl:  .  lisinopril-hydrochlorothiazide (ZESTORETIC) 20-25 MG tablet, Take 1 tablet by mouth daily., Disp: , Rfl:  .  Magnesium 250 MG TABS, Take by mouth., Disp: , Rfl:  .  Multiple Vitamin (MULTIVITAMIN) tablet, Take 1 tablet by mouth daily., Disp: , Rfl:  .  Omega-3 Fatty Acids (FISH OIL) 1000 MG CAPS, Take by mouth., Disp: , Rfl:  .  Probiotic Product (PROBIOTIC FORMULA PO), Take by mouth., Disp: , Rfl:  .  Red Yeast Rice 600 MG CAPS, Take 2 capsules by mouth daily.  , Disp: , Rfl:  .  RESVERATROL 100 MG CAPS, Take by mouth., Disp: , Rfl:  .  spironolactone (ALDACTONE) 25 MG tablet, Take 1 tablet (25 mg total) by mouth daily., Disp: 90 tablet, Rfl: 3 .  Zinc 50 MG CAPS, Take by mouth daily., Disp: , Rfl:    Family History  Problem Relation Age of Onset  . Cancer Father        leukemia  . Coronary artery disease Mother        CABG  . Heart disease Brother        "heart problems"  . Lupus Daughter      Social History   Tobacco Use  . Smoking status: Former Smoker    Packs/day: 4.00     Years: 6.00    Pack years: 24.00    Types: Cigarettes    Quit date: 05/25/1960    Years since quitting: 59.7  . Smokeless tobacco: Never Used  . Tobacco comment: Quit over 46 years ago  Substance Use Topics  . Alcohol use: No  . Drug use: Never    Allergies as of 01/22/2020 - Review Complete 01/22/2020  Allergen Reaction Noted  . Naprosyn [naproxen] Hives and Shortness Of Breath 05/26/2011  . Rosuvastatin calcium Other (See Comments) 05/15/2018  . Atorvastatin Diarrhea and Nausea Only 07/14/2018  . Gluten meal Itching 03/05/2013  . Niacin and related Rash 07/07/2015    Review of Systems:    All systems  reviewed and negative except where noted in HPI.   Physical Exam:  BP (!) 147/74   Pulse 95   Temp 98.3 F (36.8 C) (Oral)   Wt 150 lb 3.2 oz (68.1 kg)   BMI 24.24 kg/m  No LMP recorded. Patient has had a hysterectomy.  General:   Alert,  Well-developed, well-nourished, pleasant and cooperative in NAD Head:  Normocephalic and atraumatic. Eyes:  Sclera clear, no icterus.   Conjunctiva pink. Ears:  Normal auditory acuity. Nose:  No deformity, discharge, or lesions. Mouth:  No deformity or lesions,oropharynx pink & moist. Neck:  Supple; no masses or thyromegaly. Lungs:  Respirations even and unlabored.  Clear throughout to auscultation.   No wheezes, crackles, or rhonchi. No acute distress. Heart:  Regular rate and rhythm; no murmurs, clicks, rubs, or gallops. Abdomen:  Normal bowel sounds. Soft, non-tender and non-distended without masses, hepatosplenomegaly or hernias noted.  No guarding or rebound tenderness.   Partial uterine prolapse Rectal: Not performed Msk:  Symmetrical without gross deformities. Good, equal movement & strength bilaterally. Pulses:  Normal pulses noted. Extremities:  No clubbing or edema.  No cyanosis. Neurologic:  Alert and oriented x3;  grossly normal neurologically. Skin:  Intact without significant lesions or rashes. No jaundice. Psych:  Alert  and cooperative. Normal mood and affect.  Imaging Studies: Reviewed  Assessment and Plan:   BREILYN WIRZ is a 79 y.o. female with history of hypertension is seen for 3 months history of nonbloody diarrhea which has occurred after eating [place] food.  The diarrhea has resolved.  Most likely secondary to gastroenteritis after eating contaminated food. Colonoscopy revealed only diverticulosis and few aphthous in the terminal ileum. Infectious etiology was negative. There is no evidence of chronicity.  Patient has pelvic organ prolapse. Will refer to GYN for evaluation of uterine prolapse  Follow up as needed   Cephas Darby, MD

## 2020-01-31 ENCOUNTER — Ambulatory Visit (INDEPENDENT_AMBULATORY_CARE_PROVIDER_SITE_OTHER): Payer: Medicare Other

## 2020-01-31 ENCOUNTER — Other Ambulatory Visit: Payer: Self-pay

## 2020-01-31 DIAGNOSIS — R0602 Shortness of breath: Secondary | ICD-10-CM | POA: Diagnosis not present

## 2020-02-05 ENCOUNTER — Ambulatory Visit (INDEPENDENT_AMBULATORY_CARE_PROVIDER_SITE_OTHER): Payer: Medicare Other | Admitting: Obstetrics and Gynecology

## 2020-02-05 ENCOUNTER — Encounter: Payer: Self-pay | Admitting: Obstetrics and Gynecology

## 2020-02-05 ENCOUNTER — Other Ambulatory Visit: Payer: Self-pay

## 2020-02-05 VITALS — BP 165/77 | HR 80 | Ht 68.0 in | Wt 149.1 lb

## 2020-02-05 DIAGNOSIS — N8189 Other female genital prolapse: Secondary | ICD-10-CM | POA: Diagnosis not present

## 2020-02-05 DIAGNOSIS — N8111 Cystocele, midline: Secondary | ICD-10-CM

## 2020-02-05 NOTE — Progress Notes (Signed)
Patient comes in today as a new patient. She has a prolapsed bladder. She had a sling placed 30 years ago.

## 2020-02-05 NOTE — Progress Notes (Signed)
HPI:      [name] is a 79 y.o. [id number] who LMP was No LMP recorded. Patient has had a hysterectomy.  Subjective:   She presents today to discuss pelvic relaxation.  She states something is bulging from her vagina and she believes that her bladder.  She had previous pelvic repair surgery with hysterectomy "more than 30 years ago".  She denies issues with leakage of urine although she occasionally has urgency.  She denies problems with bowel movements.  Her main issue seems to be that this is bulging from the vagina and is uncomfortable because of its location. She is not sexually active. She does not use estrogen    Hx: The following portions of the patient's history were reviewed and updated as appropriate:             She  has a past medical history of Anemia, Arthritis, Clotting disorder ([place]), DLE (discoid lupus erythematosus), DVT (deep venous thrombosis) ([place]), H/O: Bell's palsy, Hyperlipidemia, Hypertension, Rash, and Wears dentures. She does not have any pertinent problems on file. She  has a past surgical history that includes gyn surgery (1989); Cholecystectomy (1987); Knee arthroscopy (2009); Knee arthroscopy (2007); Abdominal hysterectomy (1985); Cataract extraction w/PHACO (Left, 07/14/2015); Cataract extraction w/PHACO (Right, 08/25/2015); vein removal (2019); Eye surgery; and Colonoscopy with propofol (N/A, 12/12/2019). Her family history includes Cancer in her father; Coronary artery disease in her mother; Heart disease in her brother; Lupus in her daughter. She  reports that she quit smoking about 59 years ago. Her smoking use included cigarettes. She has a 24.00 pack-year smoking history. She has never used smokeless tobacco. She reports that she does not drink alcohol or use drugs. She has a current medication list which includes the following prescription(s): atorvastatin, vitamin b complex, black elderberry, vitamin d3, coq-10, lisinopril-hydrochlorothiazide,  magnesium, multivitamin, fish oil, bacillus coagulans-inulin, red yeast rice, resveratrol, zinc, and spironolactone. She is allergic to naprosyn [naproxen]; rosuvastatin calcium; atorvastatin; gluten meal; and niacin and related.       Review of Systems:  Review of Systems  Constitutional: Denied constitutional symptoms, night sweats, recent illness, fatigue, fever, insomnia and weight loss.  Eyes: Denied eye symptoms, eye pain, photophobia, vision change and visual disturbance.  Ears/Nose/Throat/Neck: Denied ear, nose, throat or neck symptoms, hearing loss, nasal discharge, sinus congestion and sore throat.  Cardiovascular: Denied cardiovascular symptoms, arrhythmia, chest pain/pressure, edema, exercise intolerance, orthopnea and palpitations.  Respiratory: Denied pulmonary symptoms, asthma, pleuritic pain, productive sputum, cough, dyspnea and wheezing.  Gastrointestinal: Denied, gastro-esophageal reflux, melena, nausea and vomiting.  Genitourinary: See HPI for additional information.  Musculoskeletal: Denied musculoskeletal symptoms, stiffness, swelling, muscle weakness and myalgia.  Dermatologic: Denied dermatology symptoms, rash and scar.  Neurologic: Denied neurology symptoms, dizziness, headache, neck pain and syncope.  Psychiatric: Denied psychiatric symptoms, anxiety and depression.  Endocrine: Denied endocrine symptoms including hot flashes and night sweats.   Meds:   Current Outpatient Medications on File Prior to Visit  Medication Sig Dispense Refill  . atorvastatin (LIPITOR) 10 MG tablet Take 10 mg by mouth daily.    . B Complex Vitamins (VITAMIN B COMPLEX) TABS Take 1 tablet by mouth daily.    Marland Kitchen BLACK ELDERBERRY PO Take 1 capsule by mouth daily.    . Cholecalciferol (VITAMIN D3) 1000 UNITS CAPS Take 1 capsule by mouth daily.    . Coenzyme Q10 (COQ-10) 200 MG CAPS 100 mg. Take one by mouth daily     . lisinopril-hydrochlorothiazide (ZESTORETIC) 20-25 MG tablet Take  1 tablet  by mouth daily.    . Magnesium 250 MG TABS Take by mouth.    . Multiple Vitamin (MULTIVITAMIN) tablet Take 1 tablet by mouth daily.    . Omega-3 Fatty Acids (FISH OIL) 1000 MG CAPS Take by mouth.    . Probiotic Product (PROBIOTIC FORMULA PO) Take by mouth.    . Red Yeast Rice 600 MG CAPS Take 2 capsules by mouth daily.      Marland Kitchen RESVERATROL 100 MG CAPS Take by mouth.    . Zinc 50 MG CAPS Take by mouth daily.    Marland Kitchen spironolactone (ALDACTONE) 25 MG tablet Take 1 tablet (25 mg total) by mouth daily. (Patient not taking: Reported on 02/05/2020) 90 tablet 3   No current facility-administered medications on file prior to visit.    Objective:     Vitals:   02/05/20 0738  BP: (!) 165/77  Pulse: 80              Physical examination   Pelvic:   Vulva: Normal appearance.  No lesions.  Vagina: No lesions or abnormalities noted.  Support:  Large fourth degree midline and some lateral degree of relaxation/cystocele.  Second-degree rectocele.  Likely some enterocele involved.  Urethra No masses tenderness or scarring.  Meatus Normal size without lesions or prolapse.  Cervix:  Surgically absent  Anus: Normal exam.  No lesions.  Perineum: Normal exam.  No lesions.    Assessment:    [id number] Patient Active Problem List   Diagnosis Date Noted  . Personal history of colonic polyps   . Varicose veins of both lower extremities with pain 05/15/2018  . Encounter for long-term (current) use of other medications 05/26/2011  . Arthritis 05/26/2011  . 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 Hypertension 05/26/2011     1. Pelvic relaxation disorder   2. Midline cystocele     Prior pelvic surgery and repairs.  Patient unsure of what procedure was performed and whether she had a previous sling mesh or other.   Plan:            1.  Multiple options discussed with her regarding her pelvic prolapse.  Complications regarding surgery in light of previous surgery discussed in detail.  Pessary discussed in  some detail, A&P repair discussed in detail.  LeFort procedure briefly discussed. Literature given. Patient would like to review all of her options with her daughter and inform us of her decision and we will begin the process. Orders No orders of the defined types were placed in this encounter.   No orders of the defined types were placed in this encounter.     F/U  No follow-ups on file. I spent 32 minutes involved in the care of this patient preparing to see the patient by obtaining and reviewing her medical history (including labs, imaging tests and prior procedures), documenting clinical information in the electronic health record (EHR), counseling and coordinating care plans, writing and sending prescriptions, ordering tests or procedures and directly communicating with the patient by discussing pertinent items from her history and physical exam as well as detailing my assessment and plan as noted above so that she has an informed understanding.  All of her questions were answered.  Finis Bud, M.D. 02/05/2020 9:33 AM

## 2020-02-11 ENCOUNTER — Telehealth: Payer: Self-pay | Admitting: Obstetrics and Gynecology

## 2020-02-11 NOTE — Telephone Encounter (Signed)
Patient has decided on what procedure she would like to have done. Colporrhaphy is what she decided.

## 2020-02-12 NOTE — Telephone Encounter (Signed)
Please advise. Do you want patient to come back I to discuss?

## 2020-02-13 NOTE — Telephone Encounter (Signed)
Scheduled patient for pre op appointment.

## 2020-02-13 NOTE — Telephone Encounter (Signed)
Tried to call patient. Voicemail not set up.

## 2020-02-26 ENCOUNTER — Other Ambulatory Visit: Payer: Self-pay

## 2020-02-26 ENCOUNTER — Other Ambulatory Visit: Payer: Self-pay | Admitting: Nurse Practitioner

## 2020-02-26 ENCOUNTER — Encounter: Payer: Self-pay | Admitting: Obstetrics and Gynecology

## 2020-02-26 ENCOUNTER — Ambulatory Visit (INDEPENDENT_AMBULATORY_CARE_PROVIDER_SITE_OTHER): Payer: Medicare Other | Admitting: Obstetrics and Gynecology

## 2020-02-26 VITALS — BP 159/79 | HR 80 | Ht 68.0 in | Wt 153.0 lb

## 2020-02-26 DIAGNOSIS — Z01818 Encounter for other preprocedural examination: Secondary | ICD-10-CM | POA: Diagnosis not present

## 2020-02-26 DIAGNOSIS — N8189 Other female genital prolapse: Secondary | ICD-10-CM

## 2020-02-26 DIAGNOSIS — N816 Rectocele: Secondary | ICD-10-CM | POA: Diagnosis not present

## 2020-02-26 DIAGNOSIS — N8111 Cystocele, midline: Secondary | ICD-10-CM

## 2020-02-26 MED ORDER — ATORVASTATIN CALCIUM 10 MG PO TABS: 10.0000 mg | ORAL_TABLET | Freq: Every day | ORAL | 1 refills | Status: DC

## 2020-02-26 MED ORDER — ESTRADIOL 0.1 MG/GM VA CREA: 0.2500 | TOPICAL_CREAM | Freq: Every day | VAGINAL | 0 refills | Status: DC

## 2020-02-26 MED ORDER — METRONIDAZOLE 500 MG PO TABS: 500.0000 mg | ORAL_TABLET | Freq: Two times a day (BID) | ORAL | 0 refills | Status: DC

## 2020-02-26 NOTE — Telephone Encounter (Signed)
She presents on atorvastatin 10 mg daily- but has ALLERGY to it- causes diarrhea and nausea. Is she taking this already- Can we remove it from her allergy list?

## 2020-02-26 NOTE — Progress Notes (Signed)
PRE-OPERATIVE HISTORY AND PHYSICAL EXAM  PCP:  Marval Regal, NP Subjective:   HPI:  Breanna Hodge is a 79 y.o. [id number].  No LMP recorded. Patient has had a hysterectomy.  She presents today for a pre-op discussion and PE.  She has the following symptoms: Pelvic relaxation with large cystocele.  Review of Systems:   Constitutional: Denied constitutional symptoms, night sweats, recent illness, fatigue, fever, insomnia and weight loss.  Eyes: Denied eye symptoms, eye pain, photophobia, vision change and visual disturbance.  Ears/Nose/Throat/Neck: Denied ear, nose, throat or neck symptoms, hearing loss, nasal discharge, sinus congestion and sore throat.  Cardiovascular: Denied cardiovascular symptoms, arrhythmia, chest pain/pressure, edema, exercise intolerance, orthopnea and palpitations.  Respiratory: Denied pulmonary symptoms, asthma, pleuritic pain, productive sputum, cough, dyspnea and wheezing.  Gastrointestinal: Denied, gastro-esophageal reflux, melena, nausea and vomiting.  Genitourinary: See HPI for additional information.  Musculoskeletal: Denied musculoskeletal symptoms, stiffness, swelling, muscle weakness and myalgia.  Dermatologic: Denied dermatology symptoms, rash and scar.  Neurologic: Denied neurology symptoms, dizziness, headache, neck pain and syncope.  Psychiatric: Denied psychiatric symptoms, anxiety and depression.  Endocrine: Denied endocrine symptoms including hot flashes and night sweats.   OB History  Gravida Para Term Preterm AB Living  13 11     2 11   SAB TAB Ectopic Multiple Live Births               # Outcome Date GA Lbr Len/2nd Weight Sex Delivery Anes PTL Lv  13 AB           12 AB           11 Para           10 Para           9 Para           8 Para           7 Para           6 Para           5 Para           4 Para           3 Para           2 Para           1 Para             Obstetric Comments  Menstrual age: 84      Age 1st Pregnancy:19    Past Medical History:  Diagnosis Date  . Anemia   . Arthritis   . Clotting disorder ([facility])    DVT, RLE during pregnancy  . DLE (discoid lupus erythematosus)    diagnosed at [facility] by skin biopsy  . DVT (deep venous thrombosis) ([facility])    RLE, associated with pregnancy  . H/O: Bell's palsy    approx 15 yrs ago, resolved  . Hyperlipidemia   . Hypertension   . Rash    recurring on RLE, reported as discoid lupus by patient per prior [facility] eval  . Wears dentures    Full upper and lower. Does NOT wear lower.    Past Surgical History:  Procedure Laterality Date  . ABDOMINAL HYSTERECTOMY  1985   with bladder tack using mesh  . CATARACT EXTRACTION W/PHACO Left 07/14/2015   Procedure: CATARACT EXTRACTION PHACO AND INTRAOCULAR LENS PLACEMENT (IOC);  Surgeon: Ronnell Freshwater, MD;  Location: [place];  Service: Ophthalmology;  Laterality:  Left;  PER DR OFFICE PT WANTS EARLY AS POSSIBLE  . CATARACT EXTRACTION W/PHACO Right 08/25/2015   Procedure: CATARACT EXTRACTION PHACO AND INTRAOCULAR LENS PLACEMENT (IOC);  Surgeon: Ronnell Freshwater, MD;  Location: [place];  Service: Ophthalmology;  Laterality: Right;  PER DR OFFICE PT WANTS AS EARLY AS POSSIBLE  . CHOLECYSTECTOMY  1987  . COLONOSCOPY WITH PROPOFOL N/A 12/12/2019   Procedure: COLONOSCOPY WITH PROPOFOL;  Surgeon: Lin Landsman, MD;  Location[facility] ENDOSCOPY;  Service: Gastroenterology;  Laterality: N/A;  . EYE SURGERY    . gyn surgery  1989   hysterectomy  . KNEE ARTHROSCOPY  2009   right  . KNEE ARTHROSCOPY  2007   right knee  . vein removal  2019   Right leg      SOCIAL HISTORY: Social History   Tobacco Use  Smoking Status Former Smoker  . Packs/day: 4.00  . Years: 6.00  . Pack years: 24.00  . Types: Cigarettes  . Quit date: 05/25/1960  . Years since quitting: 59.7  Smokeless Tobacco Never Used  Tobacco Comment   Quit over 46 years ago   Social History    Substance and Sexual Activity  Alcohol Use No   Social History   Substance and Sexual Activity  Drug Use Never    Family History  Problem Relation Age of Onset  . Cancer Father        leukemia  . Coronary artery disease Mother        CABG  . Heart disease Brother        "heart problems"  . Lupus Daughter     ALLERGIES:  Naprosyn [naproxen], Rosuvastatin calcium, Atorvastatin, Gluten meal, and Niacin and related  MEDS:   Current Outpatient Medications on File Prior to Visit  Medication Sig Dispense Refill  . atorvastatin (LIPITOR) 10 MG tablet Take 10 mg by mouth daily.    . B Complex Vitamins (VITAMIN B COMPLEX) TABS Take 1 tablet by mouth daily.    Marland Kitchen BLACK ELDERBERRY PO Take 1 capsule by mouth daily.    . Cholecalciferol (VITAMIN D3) 1000 UNITS CAPS Take 1 capsule by mouth daily.    . Coenzyme Q10 (COQ-10) 200 MG CAPS 100 mg. Take one by mouth daily     . lisinopril-hydrochlorothiazide (ZESTORETIC) 20-25 MG tablet Take 1 tablet by mouth daily.    . Magnesium 250 MG TABS Take by mouth.    . Multiple Vitamin (MULTIVITAMIN) tablet Take 1 tablet by mouth daily.    . Omega-3 Fatty Acids (FISH OIL) 1000 MG CAPS Take by mouth.    . Probiotic Product (PROBIOTIC FORMULA PO) Take by mouth.    . Red Yeast Rice 600 MG CAPS Take 2 capsules by mouth daily.      Marland Kitchen RESVERATROL 100 MG CAPS Take by mouth.    . Zinc 50 MG CAPS Take by mouth daily.    Marland Kitchen spironolactone (ALDACTONE) 25 MG tablet Take 1 tablet (25 mg total) by mouth daily. (Patient not taking: Reported on 02/26/2020) 90 tablet 3   No current facility-administered medications on file prior to visit.    Meds ordered this encounter  Medications  . metroNIDAZOLE (FLAGYL) 500 MG tablet    Sig: Take 1 tablet (500 mg total) by mouth 2 (two) times daily. Begin 5 days prior to scheduled surgery as directed.    Dispense:  10 tablet    Refill:  0  . estradiol (ESTRACE) 0.1 MG/GM vaginal cream  Sig: Place [id number] Applicatorfuls  vaginally at bedtime.    Dispense:  90 g    Refill:  0     Physical examination BP (!) 159/79   Pulse 80   Ht 5\' 8"  (1.727 m)   Wt 153 lb (69.4 kg)   BMI 23.26 kg/m   General NAD, Conversant  HEENT Atraumatic; Op clear with mmm.  Normo-cephalic. Pupils reactive. Anicteric sclerae  Thyroid/Neck Smooth without nodularity or enlargement. Normal ROM.  Neck Supple.  Skin No rashes, lesions or ulceration. Normal palpated skin turgor. No nodularity.  Breasts: No masses or discharge.  Symmetric.  No axillary adenopathy.  Lungs: Clear to auscultation.No rales or wheezes. Normal Respiratory effort, no retractions.  Heart: NSR.  3/6 SEM  No periferal edema  Abdomen: Soft.  Non-tender.  No masses.  No HSM. No hernia  Extremities: Moves all appropriately.  Normal ROM for age. No lymphadenopathy.  Neuro: Oriented to PPT.  Normal mood. Normal affect.     Pelvic:       Vulva: Normal appearance.  No lesions.  Vagina: No lesions or abnormalities noted.  Support:  Large fourth degree midline and some lateral degree of relaxation/cystocele.  Second-degree rectocele.  Likely some enterocele involved.  Urethra No masses tenderness or scarring.  Meatus Normal size without lesions or prolapse.  Cervix:  Surgically absent  Anus: Normal exam.  No lesions.  Perineum: Normal exam.  No lesions.    Assessment:   [id number] Patient Active Problem List   Diagnosis Date Noted  . Personal history of colonic polyps   . Varicose veins of both lower extremities with pain 05/15/2018  . Encounter for long-term (current) use of other medications 05/26/2011  . Arthritis 05/26/2011  . 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 Hypertension 05/26/2011    1. Preop examination   2. Midline cystocele   3. Pelvic relaxation disorder   4. Rectocele    Patient has expressed her interest in surgery.  We have discussed colporrhaphy as well as LeFort procedure and patient desires colporrhaphy.  Rationale for placement of  sling discussed in detail.  Plan:   Orders: Meds ordered this encounter  Medications  . metroNIDAZOLE (FLAGYL) 500 MG tablet    Sig: Take 1 tablet (500 mg total) by mouth 2 (two) times daily. Begin 5 days prior to scheduled surgery as directed.    Dispense:  10 tablet    Refill:  0  . estradiol (ESTRACE) 0.1 MG/GM vaginal cream    Sig: Place [id number] Applicatorfuls vaginally at bedtime.    Dispense:  90 g    Refill:  0     1.  Anterior and posterior colporrhaphy, enterocele repair, TOT  Pre-op discussions regarding Risks and Benefits of her scheduled surgery.  Anterior Repair I have discussed the procedure of anterior repair and Kelly placation.  I have informed the patient that this procedure often corrects or improves stress urinary incontinence, but that there is certainly no guarantee of her improvement.  The procedure itself was discussed.  The possible damage to the bowel, ureters or urethra was also discussed.  We have reviewed the repositioning of the bladder that often takes place at anterior repair and I have informed her that although unlikely, it is possible that a worsening of her incontinence could occur after this procedure.  I have also discussed with her the necessity of decreased lifting and physical activity following the procedure as well as the possibility that as she gets older, her stress urinary incontinence could  slowly return.  The use of vaginal Estrogen or oral Estrogen as well as other medications in the role of both stress and bladder dysynergia incontinence were discussed.  I have discussed the complication of inability to void immediately following the procedure.  The patient is aware that she may go home using a Foley catheter or may be taught the technique of self-catheterization should a complication develop.  All of her questions have been answered and I believe that she has an informed understanding of anterior repair/Kelly plication. We have specifically and  in detail discussed the complications of this repair as well as the TOT in light of the fact that she has had previous vaginal repair surgery.  This multiply is the increased risk.  We do not know if we will find permanent sutures, rubber bumpers or other effects of her previous surgery.  She understands these risks.  Posterior Repair Posterior repair was discussed with the patient.  The risks were reviewed and include:  possible damage to rectum and bowel, bleeding, infection and anesthesia.  The benefits were also discussed.  Post-op recovery with special attention to hospital stay and return to sexual function were specifically reviewed.  All her questions were answered, and I believe that she has an informed understanding of Posterior repair.  TOT I have discussed the procedure of tension free vaginal tape using the trans-obturator approach. (TOT).  I have informed the patient that this procedure often corrects or improves stress urinary incontinence.  For patients without urinary incontinence-this procedure is often performed to lower the risk of iatragenic urinary incontinence at the time of cystocele repair.The patient has been made aware that there is no guarantee of her improvement or the length of time her improvement will last.  The procedure itself was discussed in detail including possible damage to bowel, ureters, urethra and bladder.  I have informed her that the mesh is permanent.  The risk of extrusion of the mesh has also been reviewed.  The management of this complication has been discussed.  The risks of bleeding and infection were also reviewed.  I have specifically discussed the complication of inability to void following the procedure and the patient is aware that she may go home using a Foley catheter or using a self-catheterization technique.  In addition, I have discussed with the patient the use of cystoscopy to diagnose bladder injury should there be any question of this.     Regarding the polypropylene mesh and mid-urethral slings: I have review the current position statements from AUGS 2014 and the AUA.  I have given her copies of both to review.  All of her questions were answered.  She has been advised that should she have any additional questions or concerns regarding her sling procedure we would be happy to schedule a time before her surgery to discuss them. All of the patient's questions have been answered and I believe she has an informed understanding of TOT and cystoscopy.  I spent 37 minutes involved in the care of this patient preparing to see the patient by obtaining and reviewing her medical history (including labs, imaging tests and prior procedures), documenting clinical information in the electronic health record (EHR), counseling and coordinating care plans, writing and sending prescriptions, ordering tests or procedures and directly communicating with the patient by discussing pertinent items from her history and physical exam as well as detailing my assessment and plan as noted above so that she has an informed understanding.  All of her questions  were answered.  Finis Bud, M.D. 02/26/2020 9:10 AM

## 2020-02-26 NOTE — H&P (Signed)
PRE-OPERATIVE HISTORY AND PHYSICAL EXAM  PCP:  Marval Regal, NP Subjective:   HPI:  Breanna Hodge is a 79 y.o. PI:[id number].  No LMP recorded. Patient has had a hysterectomy.  She presents today for a pre-op discussion and PE.  She has the following symptoms: Pelvic relaxation with large cystocele.  Review of Systems:   Constitutional: Denied constitutional symptoms, night sweats, recent illness, fatigue, fever, insomnia and weight loss.  Eyes: Denied eye symptoms, eye pain, photophobia, vision change and visual disturbance.  Ears/Nose/Throat/Neck: Denied ear, nose, throat or neck symptoms, hearing loss, nasal discharge, sinus congestion and sore throat.  Cardiovascular: Denied cardiovascular symptoms, arrhythmia, chest pain/pressure, edema, exercise intolerance, orthopnea and palpitations.  Respiratory: Denied pulmonary symptoms, asthma, pleuritic pain, productive sputum, cough, dyspnea and wheezing.  Gastrointestinal: Denied, gastro-esophageal reflux, melena, nausea and vomiting.  Genitourinary: See HPI for additional information.  Musculoskeletal: Denied musculoskeletal symptoms, stiffness, swelling, muscle weakness and myalgia.  Dermatologic: Denied dermatology symptoms, rash and scar.  Neurologic: Denied neurology symptoms, dizziness, headache, neck pain and syncope.  Psychiatric: Denied psychiatric symptoms, anxiety and depression.  Endocrine: Denied endocrine symptoms including hot flashes and night sweats.   OB History  Gravida Para Term Preterm AB Living  13 11     2 11   SAB TAB Ectopic Multiple Live Births               # Outcome Date GA Lbr Len/2nd Weight Sex Delivery Anes PTL Lv  13 AB           12 AB           11 Para           10 Para           9 Para           8 Para           7 Para           6 Para           5 Para           4 Para           3 Para           2 Para           1 Para             Obstetric Comments  Menstrual age: 57      Age 1st Pregnancy:19    Past Medical History:  Diagnosis Date  . Anemia   . Arthritis   . Clotting disorder (HCC)    DVT, RLE during pregnancy  . DLE (discoid lupus erythematosus)    diagnosed at [facility] by skin biopsy  . DVT (deep venous thrombosis) (HCC)    RLE, associated with pregnancy  . H/O: Bell's palsy    approx 15 yrs ago, resolved  . Hyperlipidemia   . Hypertension   . Rash    recurring on RLE, reported as discoid lupus by patient per prior [facility] eval  . Wears dentures    Full upper and lower. Does NOT wear lower.    Past Surgical History:  Procedure Laterality Date  . ABDOMINAL HYSTERECTOMY  1985   with bladder tack using mesh  . CATARACT EXTRACTION W/PHACO Left 07/14/2015   Procedure: CATARACT EXTRACTION PHACO AND INTRAOCULAR LENS PLACEMENT (IOC);  Surgeon: Ronnell Freshwater, MD;  Location: [place];  Service: Ophthalmology;  Laterality:  Left;  PER DR OFFICE PT WANTS EARLY AS POSSIBLE  . CATARACT EXTRACTION W/PHACO Right 08/25/2015   Procedure: CATARACT EXTRACTION PHACO AND INTRAOCULAR LENS PLACEMENT (IOC);  Surgeon: Ronnell Freshwater, MD;  Location: [place];  Service: Ophthalmology;  Laterality: Right;  PER DR OFFICE PT WANTS AS EARLY AS POSSIBLE  . CHOLECYSTECTOMY  1987  . COLONOSCOPY WITH PROPOFOL N/A 12/12/2019   Procedure: COLONOSCOPY WITH PROPOFOL;  Surgeon: Lin Landsman, MD;  Location: [facility] ENDOSCOPY;  Service: Gastroenterology;  Laterality: N/A;  . EYE SURGERY    . gyn surgery  1989   hysterectomy  . KNEE ARTHROSCOPY  2009   right  . KNEE ARTHROSCOPY  2007   right knee  . vein removal  2019   Right leg      SOCIAL HISTORY: Social History   Tobacco Use  Smoking Status Former Smoker  . Packs/day: 4.00  . Years: 6.00  . Pack years: 24.00  . Types: Cigarettes  . Quit date: 05/25/1960  . Years since quitting: 59.7  Smokeless Tobacco Never Used  Tobacco Comment   Quit over 46 years ago   Social History    Substance and Sexual Activity  Alcohol Use No   Social History   Substance and Sexual Activity  Drug Use Never    Family History  Problem Relation Age of Onset  . Cancer Father        leukemia  . Coronary artery disease Mother        CABG  . Heart disease Brother        "heart problems"  . Lupus Daughter     ALLERGIES:  Naprosyn [naproxen], Rosuvastatin calcium, Atorvastatin, Gluten meal, and Niacin and related  MEDS:   Current Outpatient Medications on File Prior to Visit  Medication Sig Dispense Refill  . atorvastatin (LIPITOR) 10 MG tablet Take 10 mg by mouth daily.    . B Complex Vitamins (VITAMIN B COMPLEX) TABS Take 1 tablet by mouth daily.    Marland Kitchen BLACK ELDERBERRY PO Take 1 capsule by mouth daily.    . Cholecalciferol (VITAMIN D3) 1000 UNITS CAPS Take 1 capsule by mouth daily.    . Coenzyme Q10 (COQ-10) 200 MG CAPS 100 mg. Take one by mouth daily     . lisinopril-hydrochlorothiazide (ZESTORETIC) 20-25 MG tablet Take 1 tablet by mouth daily.    . Magnesium 250 MG TABS Take by mouth.    . Multiple Vitamin (MULTIVITAMIN) tablet Take 1 tablet by mouth daily.    . Omega-3 Fatty Acids (FISH OIL) 1000 MG CAPS Take by mouth.    . Probiotic Product (PROBIOTIC FORMULA PO) Take by mouth.    . Red Yeast Rice 600 MG CAPS Take 2 capsules by mouth daily.      Marland Kitchen RESVERATROL 100 MG CAPS Take by mouth.    . Zinc 50 MG CAPS Take by mouth daily.    Marland Kitchen spironolactone (ALDACTONE) 25 MG tablet Take 1 tablet (25 mg total) by mouth daily. (Patient not taking: Reported on 02/26/2020) 90 tablet 3   No current facility-administered medications on file prior to visit.    Meds ordered this encounter  Medications  . metroNIDAZOLE (FLAGYL) 500 MG tablet    Sig: Take 1 tablet (500 mg total) by mouth 2 (two) times daily. Begin 5 days prior to scheduled surgery as directed.    Dispense:  10 tablet    Refill:  0  . estradiol (ESTRACE) 0.1 MG/GM vaginal cream  Sig: Place [id number] Applicatorfuls  vaginally at bedtime.    Dispense:  90 g    Refill:  0     Physical examination BP (!) 159/79   Pulse 80   Ht 5\' 8"  (1.727 m)   Wt 153 lb (69.4 kg)   BMI 23.26 kg/m   General NAD, Conversant  HEENT Atraumatic; Op clear with mmm.  Normo-cephalic. Pupils reactive. Anicteric sclerae  Thyroid/Neck Smooth without nodularity or enlargement. Normal ROM.  Neck Supple.  Skin No rashes, lesions or ulceration. Normal palpated skin turgor. No nodularity.  Breasts: No masses or discharge.  Symmetric.  No axillary adenopathy.  Lungs: Clear to auscultation.No rales or wheezes. Normal Respiratory effort, no retractions.  Heart: NSR.  3/6 SEM  No periferal edema  Abdomen: Soft.  Non-tender.  No masses.  No HSM. No hernia  Extremities: Moves all appropriately.  Normal ROM for age. No lymphadenopathy.  Neuro: Oriented to PPT.  Normal mood. Normal affect.     Pelvic:       Vulva: Normal appearance.  No lesions.  Vagina: No lesions or abnormalities noted.  Support:  Large fourth degree midline and some lateral degree of relaxation/cystocele.  Second-degree rectocele.  Likely some enterocele involved.  Urethra No masses tenderness or scarring.  Meatus Normal size without lesions or prolapse.  Cervix:  Surgically absent  Anus: Normal exam.  No lesions.  Perineum: Normal exam.  No lesions.    Assessment:   [id number] Patient Active Problem List   Diagnosis Date Noted  . Personal history of colonic polyps   . Varicose veins of both lower extremities with pain 05/15/2018  . Encounter for long-term (current) use of other medications 05/26/2011  . Arthritis 05/26/2011  . 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 Hypertension 05/26/2011    1. Preop examination   2. Midline cystocele   3. Pelvic relaxation disorder   4. Rectocele    Patient has expressed her interest in surgery.  We have discussed colporrhaphy as well as LeFort procedure and patient desires colporrhaphy.  Rationale for placement of  sling discussed in detail.  Plan:   Orders: Meds ordered this encounter  Medications  . metroNIDAZOLE (FLAGYL) 500 MG tablet    Sig: Take 1 tablet (500 mg total) by mouth 2 (two) times daily. Begin 5 days prior to scheduled surgery as directed.    Dispense:  10 tablet    Refill:  0  . estradiol (ESTRACE) 0.1 MG/GM vaginal cream    Sig: Place [id number] Applicatorfuls vaginally at bedtime.    Dispense:  90 g    Refill:  0     1.  Anterior and posterior colporrhaphy, enterocele repair, TOT

## 2020-02-26 NOTE — Telephone Encounter (Signed)
Patient states she has been taking atorvastatin 10mg  tabs for years. She was having those symptoms before but they have subsided. I removed allergy from chart. Patient would like refill to be sent in.

## 2020-02-26 NOTE — Telephone Encounter (Signed)
Pt needs refill on Atorvastatin 10mg  [facility]   Pt wants pcp to know that she is supposed to have surgery on 03/24/20 with Dr. Amalia Hailey

## 2020-02-26 NOTE — H&P (View-Only) (Signed)
PRE-OPERATIVE HISTORY AND PHYSICAL EXAM  PCP:  Marval Regal, NP Subjective:   HPI:  Breanna Hodge is a 79 y.o. PI:[id number].  No LMP recorded. Patient has had a hysterectomy.  She presents today for a pre-op discussion and PE.  She has the following symptoms: Pelvic relaxation with large cystocele.  Review of Systems:   Constitutional: Denied constitutional symptoms, night sweats, recent illness, fatigue, fever, insomnia and weight loss.  Eyes: Denied eye symptoms, eye pain, photophobia, vision change and visual disturbance.  Ears/Nose/Throat/Neck: Denied ear, nose, throat or neck symptoms, hearing loss, nasal discharge, sinus congestion and sore throat.  Cardiovascular: Denied cardiovascular symptoms, arrhythmia, chest pain/pressure, edema, exercise intolerance, orthopnea and palpitations.  Respiratory: Denied pulmonary symptoms, asthma, pleuritic pain, productive sputum, cough, dyspnea and wheezing.  Gastrointestinal: Denied, gastro-esophageal reflux, melena, nausea and vomiting.  Genitourinary: See HPI for additional information.  Musculoskeletal: Denied musculoskeletal symptoms, stiffness, swelling, muscle weakness and myalgia.  Dermatologic: Denied dermatology symptoms, rash and scar.  Neurologic: Denied neurology symptoms, dizziness, headache, neck pain and syncope.  Psychiatric: Denied psychiatric symptoms, anxiety and depression.  Endocrine: Denied endocrine symptoms including hot flashes and night sweats.   OB History  Gravida Para Term Preterm AB Living  13 11     2 11   SAB TAB Ectopic Multiple Live Births               # Outcome Date GA Lbr Len/2nd Weight Sex Delivery Anes PTL Lv  13 AB           12 AB           11 Para           10 Para           9 Para           8 Para           7 Para           6 Para           5 Para           4 Para           3 Para           2 Para           1 Para             Obstetric Comments  Menstrual age: 79      Age 1st Pregnancy:19    Past Medical History:  Diagnosis Date   Anemia    Arthritis    Clotting disorder (HCC)    DVT, RLE during pregnancy   DLE (discoid lupus erythematosus)    diagnosed at [facility] by skin biopsy   DVT (deep venous thrombosis) (HCC)    RLE, associated with pregnancy   H/O: Bell's palsy    approx 15 yrs ago, resolved   Hyperlipidemia    Hypertension    Rash    recurring on RLE, reported as discoid lupus by patient per prior [facility] eval   Wears dentures    Full upper and lower. Does NOT wear lower.    Past Surgical History:  Procedure Laterality Date   ABDOMINAL HYSTERECTOMY  1985   with bladder tack using mesh   CATARACT EXTRACTION W/PHACO Left 07/14/2015   Procedure: CATARACT EXTRACTION PHACO AND INTRAOCULAR LENS PLACEMENT (IOC);  Surgeon: Ronnell Freshwater, MD;  Location: [place];  Service: Ophthalmology;  Laterality:  Left;  PER DR OFFICE PT WANTS EARLY AS POSSIBLE   CATARACT EXTRACTION W/PHACO Right 08/25/2015   Procedure: CATARACT EXTRACTION PHACO AND INTRAOCULAR LENS PLACEMENT ([place]);  Surgeon: Ronnell Freshwater, MD;  Location: [place];  Service: Ophthalmology;  Laterality: Right;  PER DR OFFICE PT WANTS AS EARLY AS POSSIBLE   CHOLECYSTECTOMY  1987   COLONOSCOPY WITH PROPOFOL N/A 12/12/2019   Procedure: COLONOSCOPY WITH PROPOFOL;  Surgeon: Lin Landsman, MD;  Location: [facility] ENDOSCOPY;  Service: Gastroenterology;  Laterality: N/A;   EYE SURGERY     gyn surgery  1989   hysterectomy   KNEE ARTHROSCOPY  2009   right   KNEE ARTHROSCOPY  2007   right knee   vein removal  2019   Right leg      SOCIAL HISTORY: Social History   Tobacco Use  Smoking Status Former Smoker   Packs/day: 4.00   Years: 6.00   Pack years: 24.00   Types: Cigarettes   Quit date: 05/25/1960   Years since quitting: 59.7  Smokeless Tobacco Never Used  Tobacco Comment   Quit over 46 years ago   Social History    Substance and Sexual Activity  Alcohol Use No   Social History   Substance and Sexual Activity  Drug Use Never    Family History  Problem Relation Age of Onset   Cancer Father        leukemia   Coronary artery disease Mother        CABG   Heart disease Brother        "heart problems"   Lupus Daughter     ALLERGIES:  Naprosyn [naproxen], Rosuvastatin calcium, Atorvastatin, Gluten meal, and Niacin and related  MEDS:   Current Outpatient Medications on File Prior to Visit  Medication Sig Dispense Refill   atorvastatin (LIPITOR) 10 MG tablet Take 10 mg by mouth daily.     B Complex Vitamins (VITAMIN B COMPLEX) TABS Take 1 tablet by mouth daily.     BLACK ELDERBERRY PO Take 1 capsule by mouth daily.     Cholecalciferol (VITAMIN D3) 1000 UNITS CAPS Take 1 capsule by mouth daily.     Coenzyme Q10 (COQ-10) 200 MG CAPS 100 mg. Take one by mouth daily      lisinopril-hydrochlorothiazide (ZESTORETIC) 20-25 MG tablet Take 1 tablet by mouth daily.     Magnesium 250 MG TABS Take by mouth.     Multiple Vitamin (MULTIVITAMIN) tablet Take 1 tablet by mouth daily.     Omega-3 Fatty Acids (FISH OIL) 1000 MG CAPS Take by mouth.     Probiotic Product (PROBIOTIC FORMULA PO) Take by mouth.     Red Yeast Rice 600 MG CAPS Take 2 capsules by mouth daily.       RESVERATROL 100 MG CAPS Take by mouth.     Zinc 50 MG CAPS Take by mouth daily.     spironolactone (ALDACTONE) 25 MG tablet Take 1 tablet (25 mg total) by mouth daily. (Patient not taking: Reported on 02/26/2020) 90 tablet 3   No current facility-administered medications on file prior to visit.    Meds ordered this encounter  Medications   metroNIDAZOLE (FLAGYL) 500 MG tablet    Sig: Take 1 tablet (500 mg total) by mouth 2 (two) times daily. Begin 5 days prior to scheduled surgery as directed.    Dispense:  10 tablet    Refill:  0   estradiol (ESTRACE) 0.1 MG/GM vaginal cream  Sig: Place [id number] Applicatorfuls  vaginally at bedtime.    Dispense:  90 g    Refill:  0     Physical examination BP (!) 159/79    Pulse 80    Ht 5\' 8"  (1.727 m)    Wt 153 lb (69.4 kg)    BMI 23.26 kg/m   General NAD, Conversant  HEENT Atraumatic; Op clear with mmm.  Normo-cephalic. Pupils reactive. Anicteric sclerae  Thyroid/Neck Smooth without nodularity or enlargement. Normal ROM.  Neck Supple.  Skin No rashes, lesions or ulceration. Normal palpated skin turgor. No nodularity.  Breasts: No masses or discharge.  Symmetric.  No axillary adenopathy.  Lungs: Clear to auscultation.No rales or wheezes. Normal Respiratory effort, no retractions.  Heart: NSR.  3/6 SEM  No periferal edema  Abdomen: Soft.  Non-tender.  No masses.  No HSM. No hernia  Extremities: Moves all appropriately.  Normal ROM for age. No lymphadenopathy.  Neuro: Oriented to PPT.  Normal mood. Normal affect.     Pelvic:       Vulva: Normal appearance.  No lesions.  Vagina: No lesions or abnormalities noted.  Support:  Large fourth degree midline and some lateral degree of relaxation/cystocele.  Second-degree rectocele.  Likely some enterocele involved.  Urethra No masses tenderness or scarring.  Meatus Normal size without lesions or prolapse.  Cervix:  Surgically absent  Anus: Normal exam.  No lesions.  Perineum: Normal exam.  No lesions.    Assessment:   [id number] Patient Active Problem List   Diagnosis Date Noted   Personal history of colonic polyps    Varicose veins of both lower extremities with pain 05/15/2018   Encounter for long-term (current) use of other medications 05/26/2011   Arthritis 05/26/2011   Bladder cystocele 05/26/2011   Hypertension 05/26/2011    1. Preop examination   2. Midline cystocele   3. Pelvic relaxation disorder   4. Rectocele    Patient has expressed her interest in surgery.  We have discussed colporrhaphy as well as LeFort procedure and patient desires colporrhaphy.  Rationale for placement of  sling discussed in detail.  Plan:   Orders: Meds ordered this encounter  Medications   metroNIDAZOLE (FLAGYL) 500 MG tablet    Sig: Take 1 tablet (500 mg total) by mouth 2 (two) times daily. Begin 5 days prior to scheduled surgery as directed.    Dispense:  10 tablet    Refill:  0   estradiol (ESTRACE) 0.1 MG/GM vaginal cream    Sig: Place [id number] Applicatorfuls vaginally at bedtime.    Dispense:  90 g    Refill:  0     1.  Anterior and posterior colporrhaphy, enterocele repair, TOT

## 2020-02-27 MED ORDER — ATORVASTATIN CALCIUM 10 MG PO TABS: 10.0000 mg | ORAL_TABLET | Freq: Every day | ORAL | 1 refills | Status: DC

## 2020-02-28 ENCOUNTER — Encounter: Payer: Self-pay | Admitting: Nurse Practitioner

## 2020-02-29 ENCOUNTER — Telehealth: Payer: Self-pay | Admitting: Obstetrics and Gynecology

## 2020-02-29 DIAGNOSIS — Z01818 Encounter for other preprocedural examination: Secondary | ICD-10-CM

## 2020-02-29 NOTE — Telephone Encounter (Signed)
Please advise 

## 2020-02-29 NOTE — Telephone Encounter (Signed)
Pt called in and stated that the cream that was sent to the pharmacy was $123 dollars and the pt is [facility] [address] Please advise

## 2020-03-03 MED ORDER — ESTRADIOL 0.1 MG/GM VA CREA: 0.2500 | TOPICAL_CREAM | Freq: Every day | VAGINAL | 0 refills | Status: DC

## 2020-03-03 NOTE — Telephone Encounter (Signed)
Pt called again. The pt stated that she needs a new prescription  sent to [facility] on [address] [place], [place], [place] [address]. The pt would like to be notified once this is done. Please advise

## 2020-03-03 NOTE — Telephone Encounter (Signed)
New prescription sent to pharmacy. Patient is aware.

## 2020-03-10 ENCOUNTER — Telehealth: Payer: Self-pay | Admitting: Nurse Practitioner

## 2020-03-10 MED ORDER — LISINOPRIL-HYDROCHLOROTHIAZIDE 20-25 MG PO TABS: 1.0000 | ORAL_TABLET | Freq: Every day | ORAL | 1 refills | Status: DC

## 2020-03-10 NOTE — Addendum Note (Signed)
Addended by: Elpidio Galea T on: 03/10/2020 10:40 AM   Modules accepted: Orders

## 2020-03-10 NOTE — Telephone Encounter (Signed)
[facility]

## 2020-03-10 NOTE — Telephone Encounter (Signed)
Patient walked into the office and ask that Kim refill her isinopril-hydrochlorothiazide (ZESTORETIC) 20-25 MG tablet.

## 2020-03-17 ENCOUNTER — Encounter
Admission: RE | Admit: 2020-03-17 | Discharge: 2020-03-17 | Disposition: A | Discharge status: Home / Self Care | Payer: Medicare Other | Source: Ambulatory Visit | Attending: Obstetrics and Gynecology | Admitting: Obstetrics and Gynecology

## 2020-03-17 ENCOUNTER — Other Ambulatory Visit: Payer: Self-pay

## 2020-03-17 HISTORY — DX: Cardiac murmur, unspecified: R01.1

## 2020-03-17 NOTE — Patient Instructions (Signed)
Your procedure is scheduled on: Monday March 24, 2020 Report to Day Surgery inside [facility] 2nd Floor. To find out your arrival time please call [phone] between 1PM - 3PM on Friday March 21, 2020.  Remember: Instructions that are not followed completely may result in serious medical risk,  up to and including death, or upon the discretion of your surgeon and anesthesiologist your  surgery may need to be rescheduled.     _X__ 1. Do not eat food after midnight the night before your procedure.                 No gum chewing or hard candies. You may drink clear liquids up to 2 hours                 before you are scheduled to arrive for your surgery- DO not drink clear                 liquids within 2 hours of the start of your surgery.                 Clear Liquids include:  water, apple juice without pulp, clear Gatorade, G2 or                  Gatorade Zero (avoid Red/Purple/Blue), Black Coffee or Tea (Do not add                 anything to coffee or tea).  _____2.   Complete the carbohydrate drink provided to you, 2 hours before arrival. Patient refused drink.  __X__3.  On the morning of surgery brush your teeth with toothpaste and water, you                may rinse your mouth with mouthwash if you wish.  Do not swallow any toothpaste of mouthwash.     _X__ 4.  No Alcohol for 24 hours before or after surgery.   _X__ 5.  Do Not Smoke or use e-cigarettes For 24 Hours Prior to Your Surgery.                 Do not use any chewable tobacco products for at least 6 hours prior to                 Surgery.  _X__ 6.  Do not use any recreational drugs (marijuana, cocaine, heroin, ecstacy, MDMA or other)                For at least one week prior to your surgery.  Combination of these drugs with anesthesia                May have life threatening results.  __X__7.  Notify your doctor if there is any change in your medical condition      (cold, fever,  infections).     Do not wear jewelry, make-up, hairpins, clips or nail polish. Do not wear lotions, powders, or perfumes. You may wear deodorant. Do not shave 48 hours prior to surgery. Men may shave face and neck. Do not bring valuables to the hospital.    [facility] is not responsible for any belongings or valuables.  Contacts, dentures or bridgework may not be worn into surgery. Leave your suitcase in the car. After surgery it may be brought to your room. For patients admitted to the hospital, discharge time is determined by your treatment team.   Patients discharged the day of  surgery will not be allowed to drive home.   Make arrangements for someone to be with you for the first 24 hours of your Same Day Discharge.  ____ Take these medicines the morning of surgery with A SIP OF WATER:    1. None   __X__ Stop Anti-inflammatories such as ibuprofen, Aleve, naproxen, Advil, aspirin and or BC powders.   __X__ Stop supplements until after surgery.    __X__ Do not start any herbal supplements before your surgery.

## 2020-03-18 ENCOUNTER — Telehealth: Payer: Self-pay | Admitting: Nurse Practitioner

## 2020-03-18 NOTE — Telephone Encounter (Signed)
NO VM. Pt due to schedule Medicare Annual Wellness Visit (AWV) either virtually or audio only.  No hx of AWV; please schedule at anytime with Denisa O'Brien-Blaney at [facility]

## 2020-03-20 ENCOUNTER — Other Ambulatory Visit: Payer: Self-pay

## 2020-03-20 ENCOUNTER — Other Ambulatory Visit
Admission: RE | Admit: 2020-03-20 | Discharge: 2020-03-20 | Disposition: A | Discharge status: Home / Self Care | Payer: Medicare Other | Source: Ambulatory Visit | Attending: Obstetrics and Gynecology | Admitting: Obstetrics and Gynecology

## 2020-03-20 DIAGNOSIS — Z20822 Contact with and (suspected) exposure to covid-19: Secondary | ICD-10-CM | POA: Insufficient documentation

## 2020-03-20 DIAGNOSIS — Z01812 Encounter for preprocedural laboratory examination: Secondary | ICD-10-CM | POA: Diagnosis present

## 2020-03-20 LAB — TYPE AND SCREEN
ABO/RH(D): O POS
Antibody Screen: NEGATIVE

## 2020-03-20 LAB — CBC
HCT: 42.6 % (ref 36.0–46.0)
Hemoglobin: 14.5 g/dL (ref 12.0–15.0)
MCH: 31.3 pg (ref 26.0–34.0)
MCHC: 34 g/dL (ref 30.0–36.0)
MCV: 92 fL (ref 80.0–100.0)
Platelets: 238 10*3/uL (ref 150–400)
RBC: 4.63 MIL/uL (ref 3.87–5.11)
RDW: 13 % (ref 11.5–15.5)
WBC: 9 10*3/uL (ref 4.0–10.5)
nRBC: 0 % (ref 0.0–0.2)

## 2020-03-20 LAB — SARS CORONAVIRUS 2 (TAT 6-24 HRS): SARS Coronavirus 2: NEGATIVE

## 2020-03-24 ENCOUNTER — Encounter
Admission: RE | Disposition: A | Discharge status: Home / Self Care | Payer: Self-pay | Source: Home / Self Care | Attending: Obstetrics and Gynecology

## 2020-03-24 ENCOUNTER — Encounter: Payer: Self-pay | Admitting: Obstetrics and Gynecology

## 2020-03-24 ENCOUNTER — Ambulatory Visit: Payer: Medicare Other | Admitting: Anesthesiology

## 2020-03-24 ENCOUNTER — Inpatient Hospital Stay
Admission: RE | Admit: 2020-03-24 | Discharge: 2020-03-26 | DRG: 747 | Disposition: A | Discharge status: Home / Self Care | Payer: Medicare Other | Attending: Obstetrics and Gynecology | Admitting: Obstetrics and Gynecology

## 2020-03-24 ENCOUNTER — Other Ambulatory Visit: Payer: Self-pay

## 2020-03-24 DIAGNOSIS — N993 Prolapse of vaginal vault after hysterectomy: Secondary | ICD-10-CM | POA: Diagnosis not present

## 2020-03-24 DIAGNOSIS — Z79899 Other long term (current) drug therapy: Secondary | ICD-10-CM

## 2020-03-24 DIAGNOSIS — Z8719 Personal history of other diseases of the digestive system: Secondary | ICD-10-CM

## 2020-03-24 DIAGNOSIS — I10 Essential (primary) hypertension: Secondary | ICD-10-CM | POA: Diagnosis present

## 2020-03-24 DIAGNOSIS — Z86718 Personal history of other venous thrombosis and embolism: Secondary | ICD-10-CM

## 2020-03-24 DIAGNOSIS — Z90710 Acquired absence of both cervix and uterus: Secondary | ICD-10-CM

## 2020-03-24 DIAGNOSIS — Z9889 Other specified postprocedural states: Secondary | ICD-10-CM

## 2020-03-24 DIAGNOSIS — Z87891 Personal history of nicotine dependence: Secondary | ICD-10-CM

## 2020-03-24 DIAGNOSIS — N813 Complete uterovaginal prolapse: Principal | ICD-10-CM | POA: Diagnosis present

## 2020-03-24 HISTORY — PX: ANTERIOR AND POSTERIOR REPAIR: SHX5121

## 2020-03-24 LAB — ABO/RH: ABO/RH(D): O POS

## 2020-03-24 SURGERY — ANTERIOR (CYSTOCELE) AND POSTERIOR REPAIR (RECTOCELE)
Anesthesia: General | Laterality: Bilateral

## 2020-03-24 MED ORDER — DEXAMETHASONE SODIUM PHOSPHATE 10 MG/ML IJ SOLN
INTRAMUSCULAR | Status: DC | PRN
  Administered 2020-03-24: 6 mg via INTRAVENOUS

## 2020-03-24 MED ORDER — CHLORHEXIDINE GLUCONATE 0.12 % MT SOLN: 15.0000 mL | Freq: Once | OROMUCOSAL | Status: AC

## 2020-03-24 MED ORDER — DEXTROSE IN LACTATED RINGERS 5 % IV SOLN: INTRAVENOUS | Status: DC

## 2020-03-24 MED ORDER — SUGAMMADEX SODIUM 200 MG/2ML IV SOLN
INTRAVENOUS | Status: DC | PRN
  Administered 2020-03-24: 120 mg via INTRAVENOUS

## 2020-03-24 MED ORDER — ONDANSETRON HCL 4 MG/2ML IJ SOLN: 4.0000 mg | Freq: Four times a day (QID) | INTRAMUSCULAR | Status: DC | PRN

## 2020-03-24 MED ORDER — PROPOFOL 10 MG/ML IV BOLUS
INTRAVENOUS | Status: AC
  Filled 2020-03-24: qty 20

## 2020-03-24 MED ORDER — FENTANYL CITRATE (PF) 100 MCG/2ML IJ SOLN
INTRAMUSCULAR | Status: DC | PRN
  Administered 2020-03-24 (×2): 50 ug via INTRAVENOUS

## 2020-03-24 MED ORDER — ONDANSETRON HCL 4 MG PO TABS: 4.0000 mg | ORAL_TABLET | Freq: Four times a day (QID) | ORAL | Status: DC | PRN

## 2020-03-24 MED ORDER — FENTANYL CITRATE (PF) 100 MCG/2ML IJ SOLN
INTRAMUSCULAR | Status: AC
  Filled 2020-03-24: qty 2

## 2020-03-24 MED ORDER — KETOROLAC TROMETHAMINE 30 MG/ML IJ SOLN
30.0000 mg | Freq: Four times a day (QID) | INTRAMUSCULAR | Status: DC
  Administered 2020-03-24: 30 mg via INTRAVENOUS
  Filled 2020-03-24 (×3): qty 1

## 2020-03-24 MED ORDER — FAMOTIDINE 20 MG PO TABS
ORAL_TABLET | ORAL | Status: AC
  Filled 2020-03-24: qty 1

## 2020-03-24 MED ORDER — CEFAZOLIN SODIUM-DEXTROSE 2-4 GM/100ML-% IV SOLN
INTRAVENOUS | Status: AC
  Filled 2020-03-24: qty 100

## 2020-03-24 MED ORDER — PHENYLEPHRINE HCL (PRESSORS) 10 MG/ML IV SOLN
INTRAVENOUS | Status: AC
  Filled 2020-03-24: qty 2

## 2020-03-24 MED ORDER — FENTANYL CITRATE (PF) 100 MCG/2ML IJ SOLN
25.0000 ug | INTRAMUSCULAR | Status: DC | PRN
  Administered 2020-03-24: 25 ug via INTRAVENOUS

## 2020-03-24 MED ORDER — SUCCINYLCHOLINE CHLORIDE 200 MG/10ML IV SOSY
PREFILLED_SYRINGE | INTRAVENOUS | Status: AC
  Filled 2020-03-24: qty 10

## 2020-03-24 MED ORDER — ROCURONIUM BROMIDE 10 MG/ML (PF) SYRINGE
PREFILLED_SYRINGE | INTRAVENOUS | Status: AC
  Filled 2020-03-24: qty 10

## 2020-03-24 MED ORDER — OXYCODONE-ACETAMINOPHEN 5-325 MG PO TABS
1.0000 | ORAL_TABLET | ORAL | Status: DC | PRN
  Administered 2020-03-24: 2 via ORAL
  Filled 2020-03-24: qty 2

## 2020-03-24 MED ORDER — ONDANSETRON HCL 4 MG/2ML IJ SOLN: 4.0000 mg | Freq: Once | INTRAMUSCULAR | Status: DC | PRN

## 2020-03-24 MED ORDER — VASOPRESSIN 20 UNIT/ML IV SOLN
INTRAVENOUS | Status: DC | PRN
  Administered 2020-03-24: 14 mL via INTRAMUSCULAR
  Administered 2020-03-24: 9 mL via INTRAMUSCULAR

## 2020-03-24 MED ORDER — CHLORHEXIDINE GLUCONATE 0.12 % MT SOLN
OROMUCOSAL | Status: AC
  Administered 2020-03-24: 15 mL via OROMUCOSAL
  Filled 2020-03-24: qty 15

## 2020-03-24 MED ORDER — FAMOTIDINE 20 MG PO TABS: 20.0000 mg | ORAL_TABLET | Freq: Once | ORAL | Status: DC

## 2020-03-24 MED ORDER — PROPOFOL 10 MG/ML IV BOLUS
INTRAVENOUS | Status: DC | PRN
  Administered 2020-03-24: 100 mg via INTRAVENOUS
  Administered 2020-03-24 (×2): 50 mg via INTRAVENOUS

## 2020-03-24 MED ORDER — CEFAZOLIN SODIUM-DEXTROSE 2-4 GM/100ML-% IV SOLN
2.0000 g | INTRAVENOUS | Status: AC
  Administered 2020-03-24: 2 g via INTRAVENOUS

## 2020-03-24 MED ORDER — DEXAMETHASONE SODIUM PHOSPHATE 10 MG/ML IJ SOLN
INTRAMUSCULAR | Status: AC
  Filled 2020-03-24: qty 1

## 2020-03-24 MED ORDER — ONDANSETRON HCL 4 MG/2ML IJ SOLN
INTRAMUSCULAR | Status: DC | PRN
  Administered 2020-03-24: 4 mg via INTRAVENOUS

## 2020-03-24 MED ORDER — LIDOCAINE HCL (PF) 2 % IJ SOLN
INTRAMUSCULAR | Status: AC
  Filled 2020-03-24: qty 15

## 2020-03-24 MED ORDER — ONDANSETRON HCL 4 MG/2ML IJ SOLN
INTRAMUSCULAR | Status: AC
  Filled 2020-03-24: qty 2

## 2020-03-24 MED ORDER — LACTATED RINGERS IV SOLN: INTRAVENOUS | Status: DC

## 2020-03-24 MED ORDER — SIMETHICONE 80 MG PO CHEW
80.0000 mg | CHEWABLE_TABLET | Freq: Four times a day (QID) | ORAL | Status: DC | PRN
  Administered 2020-03-24 – 2020-03-25 (×2): 80 mg via ORAL
  Filled 2020-03-24 (×2): qty 1

## 2020-03-24 MED ORDER — EPHEDRINE 5 MG/ML INJ
INTRAVENOUS | Status: AC
  Filled 2020-03-24: qty 10

## 2020-03-24 MED ORDER — SODIUM CHLORIDE (PF) 0.9 % IJ SOLN
INTRAMUSCULAR | Status: AC
  Filled 2020-03-24: qty 50

## 2020-03-24 MED ORDER — ORAL CARE MOUTH RINSE: 15.0000 mL | Freq: Once | OROMUCOSAL | Status: AC

## 2020-03-24 MED ORDER — EPHEDRINE SULFATE 50 MG/ML IJ SOLN
INTRAMUSCULAR | Status: DC | PRN
  Administered 2020-03-24 (×2): 10 mg via INTRAVENOUS

## 2020-03-24 MED ORDER — LIDOCAINE-EPINEPHRINE 1 %-1:100000 IJ SOLN
INTRAMUSCULAR | Status: AC
  Filled 2020-03-24: qty 2

## 2020-03-24 MED ORDER — VASOPRESSIN 20 UNIT/ML IV SOLN
INTRAVENOUS | Status: AC
  Filled 2020-03-24: qty 1

## 2020-03-24 MED ORDER — ROCURONIUM BROMIDE 100 MG/10ML IV SOLN
INTRAVENOUS | Status: DC | PRN
  Administered 2020-03-24: 50 mg via INTRAVENOUS

## 2020-03-24 MED ORDER — LIDOCAINE HCL (CARDIAC) PF 100 MG/5ML IV SOSY
PREFILLED_SYRINGE | INTRAVENOUS | Status: DC | PRN
  Administered 2020-03-24: 60 mg via INTRAVENOUS

## 2020-03-24 MED ORDER — GABAPENTIN 100 MG PO CAPS
100.0000 mg | ORAL_CAPSULE | Freq: Three times a day (TID) | ORAL | Status: DC
  Administered 2020-03-24 – 2020-03-25 (×3): 100 mg via ORAL
  Filled 2020-03-24 (×4): qty 1

## 2020-03-24 SURGICAL SUPPLY — 74 items
ADH LQ OCL WTPRF AMP STRL LF (MISCELLANEOUS) ×1
ADH SKN CLS APL DERMABOND .7 (GAUZE/BANDAGES/DRESSINGS) ×1
ADHESIVE MASTISOL STRL (MISCELLANEOUS) ×3 IMPLANT
BAG DECANTER FOR FLEXI CONT (MISCELLANEOUS) ×1 IMPLANT
BAG DRN RND TRDRP ANRFLXCHMBR (UROLOGICAL SUPPLIES) ×1
BAG URINE DRAIN 2000ML AR STRL (UROLOGICAL SUPPLIES) ×3 IMPLANT
BLADE SURG 15 STRL LF DISP TIS (BLADE) ×1 IMPLANT
BLADE SURG 15 STRL SS (BLADE) ×3
BLADE SURG 15 STRL SS SAFETY (BLADE) ×3 IMPLANT
BLADE SURG SZ10 CARB STEEL (BLADE) ×3 IMPLANT
BNDG GAUZE 4.5X4.1 6PLY STRL (MISCELLANEOUS) ×3 IMPLANT
CANISTER SUCT 1200ML W/VALVE (MISCELLANEOUS) ×3 IMPLANT
CATH FOLEY 2WAY  5CC 16FR (CATHETERS)
CATH FOLEY 2WAY 5CC 16FR (CATHETERS)
CATH URTH 16FR FL 2W BLN LF (CATHETERS) IMPLANT
CLEANER CAUTERY TIP 5X5 PAD (MISCELLANEOUS) ×1 IMPLANT
CLOSURE WOUND 1/2 X4 (GAUZE/BANDAGES/DRESSINGS) ×1
COVER MAYO STAND REUSABLE (DRAPES) ×3 IMPLANT
COVER WAND RF STERILE (DRAPES) ×3 IMPLANT
DERMABOND ADVANCED (GAUZE/BANDAGES/DRESSINGS) ×2
DERMABOND ADVANCED .7 DNX12 (GAUZE/BANDAGES/DRESSINGS) ×1 IMPLANT
DRAPE LAPAROTOMY 100X77 ABD (DRAPES) ×3 IMPLANT
DRAPE PERI LITHO V/GYN (MISCELLANEOUS) ×3 IMPLANT
DRAPE UNDER BUTTOCK W/FLU (DRAPES) ×3 IMPLANT
DRAPE UTILITY 15X26 TOWEL STRL (DRAPES) ×3 IMPLANT
DRAPE XRAY CASSETTE 23X24 (DRAPES) ×1 IMPLANT
ELECT CAUTERY BLADE 6.4 (BLADE) ×3 IMPLANT
ELECT REM PT RETURN 9FT ADLT (ELECTROSURGICAL) ×3
ELECTRODE REM PT RTRN 9FT ADLT (ELECTROSURGICAL) ×1 IMPLANT
GAUZE 4X4 16PLY RFD (DISPOSABLE) ×11 IMPLANT
GAUZE PACK 2X3YD (GAUZE/BANDAGES/DRESSINGS) ×3 IMPLANT
GLOVE BIO SURGEON STRL SZ 6.5 (GLOVE) ×2 IMPLANT
GLOVE BIO SURGEON STRL SZ8 (GLOVE) ×3 IMPLANT
GLOVE BIO SURGEONS STRL SZ 6.5 (GLOVE) ×1
GLOVE BIOGEL PI ORTHO PRO 7.5 (GLOVE) ×2
GLOVE INDICATOR 7.0 STRL GRN (GLOVE) ×3 IMPLANT
GLOVE PI ORTHO PRO STRL 7.5 (GLOVE) ×1 IMPLANT
GOWN STRL REUS W/ TWL LRG LVL3 (GOWN DISPOSABLE) ×2 IMPLANT
GOWN STRL REUS W/ TWL XL LVL3 (GOWN DISPOSABLE) ×1 IMPLANT
GOWN STRL REUS W/TWL 2XL LVL3 (GOWN DISPOSABLE) ×3 IMPLANT
GOWN STRL REUS W/TWL LRG LVL3 (GOWN DISPOSABLE) ×9
GOWN STRL REUS W/TWL XL LVL3 (GOWN DISPOSABLE) ×3
HANDLE YANKAUER SUCT BULB TIP (MISCELLANEOUS) ×3 IMPLANT
IV LACTATED RINGERS 1000ML (IV SOLUTION) ×1 IMPLANT
NDL SAFETY ECLIPSE 18X1.5 (NEEDLE) ×1 IMPLANT
NDL SPNL 22GX3.5 QUINCKE BK (NEEDLE) ×1 IMPLANT
NEEDLE HYPO 18GX1.5 SHARP (NEEDLE) ×3
NEEDLE HYPO 22GX1.5 SAFETY (NEEDLE) ×3 IMPLANT
NEEDLE SPNL 22GX3.5 QUINCKE BK (NEEDLE) ×3 IMPLANT
NS IRRIG 500ML POUR BTL (IV SOLUTION) ×3 IMPLANT
PACK BASIN MINOR (MISCELLANEOUS) ×3 IMPLANT
PAD CLEANER CAUTERY TIP 5X5 (MISCELLANEOUS)
PAD OB MATERNITY 4.3X12.25 (PERSONAL CARE ITEMS) ×3 IMPLANT
PAD PREP 24X41 OB/GYN DISP (PERSONAL CARE ITEMS) ×3 IMPLANT
PENCIL ELECTRO HAND CTR (MISCELLANEOUS) ×3 IMPLANT
SET CYSTO W/LG BORE CLAMP LF (SET/KITS/TRAYS/PACK) ×1 IMPLANT
SLING TRANSOBTURATOR OBTRYX (Sling) ×3 IMPLANT
STRAP SAFETY 5IN WIDE (MISCELLANEOUS) ×3 IMPLANT
STRIP CLOSURE SKIN 1/2X4 (GAUZE/BANDAGES/DRESSINGS) ×2 IMPLANT
SURGILUBE 2OZ TUBE FLIPTOP (MISCELLANEOUS) ×3 IMPLANT
SUT VIC AB 0 CT1 27 (SUTURE) ×3
SUT VIC AB 0 CT1 27XCR 8 STRN (SUTURE) ×1 IMPLANT
SUT VIC AB 0 CT1 36 (SUTURE) ×8 IMPLANT
SUT VIC AB 2-0 CT1 (SUTURE) ×3 IMPLANT
SUT VICRYL 0 AB UR-6 (SUTURE) ×6 IMPLANT
SUT VICRYL+ 3-0 36IN CT-1 (SUTURE) ×3 IMPLANT
SUTURE VIC 1-0 (SUTURE) ×2 IMPLANT
SYR 10ML LL (SYRINGE) ×3 IMPLANT
SYR CONTROL 10ML LL (SYRINGE) ×3 IMPLANT
SYRINGE IRR TOOMEY STRL 70CC (SYRINGE) ×1 IMPLANT
TAPE STRIPS DRAPE STRL (GAUZE/BANDAGES/DRESSINGS) ×2 IMPLANT
TOWEL OR 17X26 4PK STRL BLUE (TOWEL DISPOSABLE) ×3 IMPLANT
TUBING CONNECTING 10 (TUBING) ×2 IMPLANT
TUBING CONNECTING 10' (TUBING) ×1

## 2020-03-24 NOTE — Interval H&P Note (Signed)
History and Physical Interval Note:  03/24/2020 7:49 AM  Dayton Martes  has presented today for surgery, with the diagnosis of A&P repair (colporrhaphy)  Enterocele repair.  The various methods of treatment have been discussed with the patient and family. After consideration of risks, benefits and other options for treatment, the patient has consented to  Procedure(s): ANTERIOR (CYSTOCELE) AND POSTEROR REPAIR (RECTOCELE) (Bilateral) REPAIR OF ENTEROCELE (Bilateral) as a surgical intervention.  The patient's history has been reviewed, patient examined, no change in status, stable for surgery.  I have reviewed the patient's chart and labs.  Questions were answered to the patient's satisfaction.    Again discussed possibility of Lefort procedure because of large prolapse, but pt says A&P repair as previously discussed is her desire.   Jeannie Fend

## 2020-03-24 NOTE — Anesthesia Procedure Notes (Signed)
Procedure Name: Intubation Date/Time: 03/24/2020 8:33 AM Performed by: Letitia Neri, CRNA Pre-anesthesia Checklist: Patient identified, Patient being monitored, Timeout performed, Emergency Drugs available and Suction available Patient Re-evaluated:Patient Re-evaluated prior to induction Oxygen Delivery Method: Circle system utilized Preoxygenation: Pre-oxygenation with 100% oxygen Induction Type: IV induction Ventilation: Mask ventilation without difficulty Laryngoscope Size: Mac and 3 Grade View: Grade I Tube type: Oral Tube size: 7.0 mm Number of attempts: 1 Placement Confirmation: ETT inserted through vocal cords under direct vision,  positive ETCO2 and breath sounds checked- equal and bilateral Secured at: 21 cm Tube secured with: Tape Dental Injury: Teeth and Oropharynx as per pre-operative assessment

## 2020-03-24 NOTE — Op Note (Addendum)
    `      OPERATIVE NOTE 03/24/2020 11:39 AM  PRE-OPERATIVE DIAGNOSIS:  1) A&P repair (colporrhaphy)   2)Enterocele repair  POST-OPERATIVE DIAGNOSIS:  Same  OPERATION: Anterior Vagina Repair   Posterior Vaginal Repair   Enterocoele Repair   TOT  SURGEON(S): Surgeon(s) and Role:    Harlin Heys, MD - Primary    * Rubie Maid, MD - Assisting  No other capable assistant available for this surgery which requires an experienced, high level assistant.   ANESTHESIA: General  ESTIMATED BLOOD LOSS: 25 mL  OPERATIVE FINDINGS: Large cystocoele, enterocoele and rectocoele.  Vaginal vault prolapse  SPECIMEN: * No specimens in log *  COMPLICATIONS: None  DRAINS: Foley to gravity  DISPOSITION: Stable to recovery room  PROCEDURE: The vaginal mucosa beginning at the vaginal cuff scarf and overlying the bladder, was grasped with Allis clamps and injected with a dilute Pitressin solution in the midline. A midline incision was made to the level of the urethra. The vaginal mucosa was dissected laterally from the underlying attenuated fascia. A Foley catheter was placed within the bladder and the bladder was emptied. Clear urine was noted. The obturator foramina were identified in the usual manner bilaterally and marked with a marking pen the skin and subcutaneous tissues were injected with a dilute Pitressin solution. Stab incisions were made and the TOT trochars were placed through these incisions onto the operator's finger in the vagina which was retracted and bladder medially. The vaginal tape was then placed on the trochars and reversed through these incisions. A Kelly clamp was placed under the tape and the sleeves of the tape were removed. The tape was noted to be correctly positioned underneath the urethra without twists. The excess tape was removed at the level of the skin. Steri-Strips were applied over these small skin incisions. A typical Kelly plication was performed  carefully covering the tension-free vaginal tape with thickened fascia. The bladder was plicated several sutures of 3-0 Vicryl.  A shelf of fascia was then approximated in the midline placing the bladder back in its more anatomic position.A large amount of excess vaginal mucosa was trimmed. Vaginal mucosa was then closed in the midline with interrupted sutures to the level of the vaginal cuff.  The posterior fourchette at approximately the hymenal ring was grasped using Allis clamps. The posterior vaginal mucosa was injected in the midline with a dilute Pitressin solution. A midline incision was made through the vaginal mucosa to the level of the vaginal cuff, and the vaginal mucosa was dissected laterally exposing the underlying attenuated fascia. Peritoneum over lying fatty tissue was identified and the enterocoele sac was tied of and approximated in a purse-string manner. Beginning at the vaginal cuff the attenuated fascia was grasped laterally and approximated in the midline thickening and tightening the fascia. These sutures were carried down to the level of the perineum. The excess vaginal mucosa was trimmed. The vagina was closed with interrupted sutures beginning at the vaginal cuff and carried down toward the perineal body. The perineal body was reinforced with multiple sutures of Vicryl. The mucosa was then closed over the perineal body in a subcuticular manner. Hemostasis was noted.  Clear urine was noted in the Foley.  Finis Bud, M.D. 03/24/2020 11:39 AM

## 2020-03-24 NOTE — Transfer of Care (Signed)
Immediate Anesthesia Transfer of Care Note  Patient: Breanna Hodge  Procedure(s) Performed: ANTERIOR (CYSTOCELE) AND POSTEROR REPAIR (RECTOCELE) (Bilateral ) REPAIR OF ENTEROCELE(TOTAL TRANS-OBTURATOR APPROACH) (Bilateral )  Patient Location: PACU  Anesthesia Type:General  Level of Consciousness: sedated  Airway & Oxygen Therapy: Patient Spontanous Breathing  Post-op Assessment: Report given to RN and Post -op Vital signs reviewed and stable  Post vital signs: Reviewed and stable  Last Vitals:  Vitals Value Taken Time  BP 131/60 03/24/20 1047  Temp    Pulse 80 03/24/20 1050  Resp 19 03/24/20 1050  SpO2 94 % 03/24/20 1050  Vitals shown include unvalidated device data.  Last Pain:  Vitals:   03/24/20 0655  TempSrc: Oral  PainSc: 0-No pain         Complications: No complications documented.

## 2020-03-24 NOTE — Anesthesia Postprocedure Evaluation (Signed)
Anesthesia Post Note  Patient: Breanna Hodge  Procedure(s) Performed: ANTERIOR (CYSTOCELE) AND POSTEROR REPAIR (RECTOCELE) (Bilateral ) REPAIR OF ENTEROCELE(TOTAL TRANS-OBTURATOR APPROACH) (Bilateral )  Patient location during evaluation: PACU Anesthesia Type: General Level of consciousness: awake and alert and oriented Pain management: pain level controlled Vital Signs Assessment: post-procedure vital signs reviewed and stable Respiratory status: spontaneous breathing Cardiovascular status: blood pressure returned to baseline Anesthetic complications: no   No complications documented.   Last Vitals:  Vitals:   03/24/20 1130 03/24/20 1230  BP:  115/61  Pulse: 73 62  Resp: 17 18  Temp: (!) 36.1 C 36.6 C  SpO2: 98% 100%    Last Pain:  Vitals:   03/24/20 1230  TempSrc: Oral  PainSc:                  Nick Armel

## 2020-03-24 NOTE — Anesthesia Preprocedure Evaluation (Signed)
Anesthesia Evaluation  Patient identified by MRN, date of birth, ID band Patient awake    Reviewed: Allergy & Precautions, H&P , NPO status , Patient's Chart, lab work & pertinent test results, reviewed documented beta blocker date and time   History of Anesthesia Complications Negative for: history of anesthetic complications  Airway Mallampati: II  TM Distance: >3 FB Neck ROM: full    Dental  (+) Edentulous Upper, Edentulous Lower, Upper Dentures, Lower Dentures   Pulmonary neg pulmonary ROS, former smoker,    Pulmonary exam normal breath sounds clear to auscultation       Cardiovascular Exercise Tolerance: Good hypertension, (-) angina(-) Past MI and (-) Cardiac Stents Normal cardiovascular exam(-) dysrhythmias + Valvular Problems/Murmurs  Rhythm:regular Rate:Normal     Neuro/Psych negative neurological ROS  negative psych ROS   GI/Hepatic negative GI ROS, Neg liver ROS,   Endo/Other  negative endocrine ROS  Renal/GU negative Renal ROS  negative genitourinary   Musculoskeletal  (+) Arthritis , Osteoarthritis,    Abdominal   Peds  Hematology negative hematology ROS (+) anemia ,   Anesthesia Other Findings Past Medical History: No date: Anemia No date: Arthritis No date: Clotting disorder (HCC)     Comment:  DVT, RLE during pregnancy No date: DLE (discoid lupus erythematosus)     Comment:  diagnosed at [facility] by skin biopsy No date: DVT (deep venous thrombosis) (HCC)     Comment:  RLE, associated with pregnancy No date: H/O: Bell's palsy     Comment:  approx 15 yrs ago, resolved No date: Hyperlipidemia No date: Hypertension No date: Rash     Comment:  recurring on RLE, reported as discoid lupus by patient               per prior [facility] eval No date: Wears dentures     Comment:  Full upper and lower. Does NOT wear lower.   Reproductive/Obstetrics negative OB ROS                              Anesthesia Physical  Anesthesia Plan  ASA: II  Anesthesia Plan: General   Post-op Pain Management:    Induction: Intravenous  PONV Risk Score and Plan: 2 and Propofol infusion and TIVA  Airway Management Planned: Oral ETT  Additional Equipment:   Intra-op Plan:   Post-operative Plan: Extubation in OR  Informed Consent: I have reviewed the patients History and Physical, chart, labs and discussed the procedure including the risks, benefits and alternatives for the proposed anesthesia with the patient or authorized representative who has indicated his/her understanding and acceptance.     Dental Advisory Given  Plan Discussed with: Anesthesiologist, CRNA and Surgeon  Anesthesia Plan Comments:         Anesthesia Quick Evaluation

## 2020-03-25 ENCOUNTER — Encounter: Payer: Self-pay | Admitting: Obstetrics and Gynecology

## 2020-03-25 DIAGNOSIS — Z79899 Other long term (current) drug therapy: Secondary | ICD-10-CM | POA: Diagnosis not present

## 2020-03-25 DIAGNOSIS — N813 Complete uterovaginal prolapse: Secondary | ICD-10-CM | POA: Diagnosis present

## 2020-03-25 DIAGNOSIS — Z90710 Acquired absence of both cervix and uterus: Secondary | ICD-10-CM | POA: Diagnosis not present

## 2020-03-25 DIAGNOSIS — I10 Essential (primary) hypertension: Secondary | ICD-10-CM | POA: Diagnosis present

## 2020-03-25 DIAGNOSIS — Z86718 Personal history of other venous thrombosis and embolism: Secondary | ICD-10-CM | POA: Diagnosis not present

## 2020-03-25 DIAGNOSIS — Z87891 Personal history of nicotine dependence: Secondary | ICD-10-CM | POA: Diagnosis not present

## 2020-03-25 DIAGNOSIS — Z8719 Personal history of other diseases of the digestive system: Secondary | ICD-10-CM | POA: Diagnosis not present

## 2020-03-25 MED ORDER — POLYETHYLENE GLYCOL 3350 17 G PO PACK
17.0000 g | PACK | Freq: Every day | ORAL | Status: DC
  Administered 2020-03-25 – 2020-03-26 (×2): 17 g via ORAL
  Filled 2020-03-25 (×3): qty 1

## 2020-03-25 MED ORDER — LISINOPRIL 20 MG PO TABS
20.0000 mg | ORAL_TABLET | Freq: Every day | ORAL | Status: DC
  Administered 2020-03-25 – 2020-03-26 (×2): 20 mg via ORAL
  Filled 2020-03-25 (×2): qty 1

## 2020-03-25 NOTE — Progress Notes (Signed)
Patient ID: Breanna Hodge, female   DOB: 08/13/1941, 79 y.o.   MRN: [id number]       POST-OP NOTE - DAY # 1  Subjective:   The patient does not have complaints.  She is ambulating well. She is taking PO well. Her pain is well controlled with her current medications.   Objective:  BP (!) 147/67 (BP Location: Right Arm) Comment: nurse [place] notified  Pulse 74   Temp 98.6 F (37 C) (Oral)   Resp 18   Ht 5\' 7"  (1.702 m)   Wt 68 kg   SpO2 100%   BMI 23.49 kg/m     Abdomen:                          Abdomen soft and nontender without distention, masses       Foley in place - clear urine    Assessment:   Doing well.  Normal progress as expected.     Plan:   Patient not interested in going home with a Foley or learning self cath.        Foley clamping regimen today   Voiding trial tomorrow   Expect discharge tomorrow morning   Finis Bud, M.D. 03/25/2020 8:18 AM

## 2020-03-26 NOTE — Progress Notes (Signed)
Patient discharged home with daughter. Discharge instructions given and reviewed with patient. Patient verbalized understanding. Pt to f/u 04/01/2020 0945 am. Escorted out by auxillary.

## 2020-03-26 NOTE — Progress Notes (Signed)
Foley catheter emptied at this time; 340mL urine; catheter tubing clamped; catheter tubing to be unclamped at 0800, catheter removed and pt up to toilet to void right after (per order)

## 2020-03-26 NOTE — Discharge Summary (Signed)
Discharge Summary  Admit date: 03/24/2020  Discharge Date and Time:03/26/2020  12:58 PM  Discharge to:  Home  Admission Diagnosis: Present on Admission: . 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  She is ambulating well. She is taking PO well. Her pain is well controlled with her current medications. She is urinating without difficulty and is passing flatus.   Objective:  BP 134/67 (BP Location: Left Arm)   Pulse 68   Temp 98 F (36.7 C) (Oral)   Resp 18   Ht 5\' 7"  (1.702 m)   Wt 68 kg   SpO2 96%   BMI 23.49 kg/m     Abdomen:                         clean, dry, no drainage    Assessment:   Doing well.  Normal progress as expected.   Voiding trial   Plan:        Discharge home.                       Medications as directed.  Hospital Course:     Condition at Discharge:  good Discharge Medications:  Allergies as of 03/26/2020      Reactions   Naprosyn [naproxen] Hives, Shortness Of Breath   Rosuvastatin Calcium Other (See Comments)   Pt states loss of use on rt side     Niacin And Related Rash      Medication List    STOP taking these medications   estradiol 0.1 MG/GM vaginal cream Commonly known as: ESTRACE   metroNIDAZOLE 500 MG tablet Commonly known as: FLAGYL   spironolactone 25 MG tablet Commonly known as: ALDACTONE     TAKE these medications   ascorbic acid 500 MG tablet Commonly known as: VITAMIN C Take 500 mg by mouth daily.   aspirin EC 81 MG tablet Take 81 mg by mouth every Monday, Wednesday,  and Friday. Swallow whole.   atorvastatin 10 MG tablet Commonly known as: LIPITOR Take 1 tablet (10 mg total) by mouth daily.   BLACK ELDERBERRY PO Take 1,000 mg by mouth daily.   CALCIUM 1200+D3 PO Take 1 tablet by mouth daily.   CoQ-10 100 MG Caps Take 100 mg by mouth daily.   DIGESTIVE HEALTH PROBIOTIC PO Take 1 capsule by mouth daily.   GARLIC PO Take 0,865 mg by mouth daily.   Krill Oil 1000 MG Caps Take 1,000 mg by mouth every evening.   lisinopril-hydrochlorothiazide 20-25 MG tablet Commonly known as: ZESTORETIC Take 1 tablet by mouth daily. What changed: when to take this   Magnesium 500 MG Caps Take 500 mg by mouth daily.   multivitamin with minerals Tabs tablet Take 1 tablet by mouth daily. Centrum Silver   OSTEO BI-FLEX JOINT SHIELD PO Take 1 tablet by mouth every evening.  Red Yeast Rice 600 MG Caps Take 1,200 mg by mouth daily in the afternoon.   Vitamin D3 50 MCG (2000 UT) Tabs Take by mouth.   Vitamin D3 25 MCG (1000 UT) Caps Take 1 capsule by mouth daily.   Zinc 50 MG Caps Take 50 mg by mouth daily.            Discharge Care Instructions  (From admission, onward)         Start     Ordered   03/26/20 0000  No dressing needed       Comments: Keep wound area clean and dry as directed   03/26/20 1255           Follow Up:    Follow-up Information    Harlin Heys, MD Follow up in 1 week(s).   Specialties: Obstetrics and Gynecology, Radiology Contact information: [address] [place] [address] [phone]               Finis Bud, M.D. 03/26/2020 12:58 PM

## 2020-04-01 ENCOUNTER — Other Ambulatory Visit: Payer: Self-pay

## 2020-04-01 ENCOUNTER — Ambulatory Visit (INDEPENDENT_AMBULATORY_CARE_PROVIDER_SITE_OTHER): Payer: Medicare Other | Admitting: Obstetrics and Gynecology

## 2020-04-01 ENCOUNTER — Encounter: Payer: Self-pay | Admitting: Obstetrics and Gynecology

## 2020-04-01 VITALS — BP 171/98 | HR 73 | Ht 67.5 in | Wt 153.0 lb

## 2020-04-01 DIAGNOSIS — Z9889 Other specified postprocedural states: Secondary | ICD-10-CM

## 2020-04-01 NOTE — Progress Notes (Signed)
HPI:      [name] is a 79 y.o. [id number] who LMP was No LMP recorded. Patient has had a hysterectomy.  Subjective:   She presents today she is doing very well 1 week postop.  She is urinating without trouble.  She is not taking any pain medicine.  She reports nothing is falling out of her vagina anymore.  She has occasional spotting but no bleeding.    Hx: The following portions of the patient's history were reviewed and updated as appropriate:             She  has a past medical history of Anemia, Arthritis, Clotting disorder ([place]), Complication of anesthesia (1976), DLE (discoid lupus erythematosus), DVT (deep venous thrombosis) ([place]), H/O: Bell's palsy, Heart murmur, Hyperlipidemia, Hypertension, Rash, and Wears dentures. She does not have any pertinent problems on file. She  has a past surgical history that includes gyn surgery (1989); Cholecystectomy (1987); Knee arthroscopy (2009); Knee arthroscopy (2007); Abdominal hysterectomy (1985); Cataract extraction w/PHACO (Left, 07/14/2015); Cataract extraction w/PHACO (Right, 08/25/2015); vein removal (2019); Eye surgery; Colonoscopy with propofol (N/A, 12/12/2019); and Anterior and posterior repair (Bilateral, 03/24/2020). Her family history includes Cancer in her father; Coronary artery disease in her mother; Heart disease in her brother; Lupus in her daughter. She  reports that she quit smoking about 59 years ago. Her smoking use included cigarettes. She has a 24.00 pack-year smoking history. She has never used smokeless tobacco. She reports that she does not drink alcohol and does not use drugs. She has a current medication list which includes the following prescription(s): ascorbic acid, aspirin ec, atorvastatin, black elderberry, calcium-magnesium-vitamin d, vitamin d3, vitamin d3, NAT-55, garlic, krill oil, lactobacillus, lisinopril-hydrochlorothiazide, magnesium, misc natural products, multivitamin with minerals, red yeast rice,  and zinc. She is allergic to naprosyn [naproxen], rosuvastatin calcium, and niacin and related.       Review of Systems:  Review of Systems  Constitutional: Denied constitutional symptoms, night sweats, recent illness, fatigue, fever, insomnia and weight loss.  Eyes: Denied eye symptoms, eye pain, photophobia, vision change and visual disturbance.  Ears/Nose/Throat/Neck: Denied ear, nose, throat or neck symptoms, hearing loss, nasal discharge, sinus congestion and sore throat.  Cardiovascular: Denied cardiovascular symptoms, arrhythmia, chest pain/pressure, edema, exercise intolerance, orthopnea and palpitations.  Respiratory: Denied pulmonary symptoms, asthma, pleuritic pain, productive sputum, cough, dyspnea and wheezing.  Gastrointestinal: Denied, gastro-esophageal reflux, melena, nausea and vomiting.  Genitourinary: Denied genitourinary symptoms including symptomatic vaginal discharge, pelvic relaxation issues, and urinary complaints.  Musculoskeletal: Denied musculoskeletal symptoms, stiffness, swelling, muscle weakness and myalgia.  Dermatologic: Denied dermatology symptoms, rash and scar.  Neurologic: Denied neurology symptoms, dizziness, headache, neck pain and syncope.  Psychiatric: Denied psychiatric symptoms, anxiety and depression.  Endocrine: Denied endocrine symptoms including hot flashes and night sweats.   Meds:   Current Outpatient Medications on File Prior to Visit  Medication Sig Dispense Refill  . ascorbic acid (VITAMIN C) 500 MG tablet Take 500 mg by mouth daily.    Marland Kitchen aspirin EC 81 MG tablet Take 81 mg by mouth every Monday, Wednesday, and Friday. Swallow whole.    Marland Kitchen atorvastatin (LIPITOR) 10 MG tablet Take 1 tablet (10 mg total) by mouth daily. 90 tablet 1  . BLACK ELDERBERRY PO Take 1,000 mg by mouth daily.     . Calcium-Magnesium-Vitamin D (CALCIUM 1200+D3 PO) Take 1 tablet by mouth daily.    . Cholecalciferol (VITAMIN D3) 1000 UNITS CAPS Take 1 capsule by mouth  daily.    Marland Kitchen  Cholecalciferol (VITAMIN D3) 50 MCG (2000 UT) TABS Take by mouth.    . Coenzyme Q10 (COQ-10) 100 MG CAPS Take 100 mg by mouth daily.    Marland Kitchen GARLIC PO Take 5,732 mg by mouth daily.    [name] Oil 1000 MG CAPS Take 1,000 mg by mouth every evening.    . Lactobacillus (DIGESTIVE HEALTH PROBIOTIC PO) Take 1 capsule by mouth daily.    Marland Kitchen lisinopril-hydrochlorothiazide (ZESTORETIC) 20-25 MG tablet Take 1 tablet by mouth daily. (Patient taking differently: Take 1 tablet by mouth at bedtime. ) 90 tablet 1  . Magnesium 500 MG CAPS Take 500 mg by mouth daily.    . Misc Natural Products (OSTEO BI-FLEX JOINT SHIELD PO) Take 1 tablet by mouth every evening.    . Multiple Vitamin (MULTIVITAMIN WITH MINERALS) TABS tablet Take 1 tablet by mouth daily. Centrum Silver    . Red Yeast Rice 600 MG CAPS Take 1,200 mg by mouth daily in the afternoon.    . Zinc 50 MG CAPS Take 50 mg by mouth daily.     No current facility-administered medications on file prior to visit.    Objective:     Vitals:   04/01/20 0952  BP: (!) 171/98  Pulse: 73                Assessment:    [id number] Patient Active Problem List   Diagnosis Date Noted  . 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 Postoperative state 03/24/2020  . Personal history of colonic polyps   . Varicose veins of both lower extremities with pain 05/15/2018  . Encounter for long-term (current) use of other medications 05/26/2011  . Arthritis 05/26/2011  . 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 Hypertension 05/26/2011     1. Post-operative state     Patient with excellent recovery.   Plan:            1.  Reinforced necessity of no heavy lifting.  2.  Continue current postop course with voiding schedule. Orders No orders of the defined types were placed in this encounter.   No orders of the defined types were placed in this encounter.     F/U  Return in about 5 weeks (around 05/06/2020).  Finis Bud,  M.D. 04/01/2020 10:07 AM

## 2020-04-03 ENCOUNTER — Encounter: Payer: Self-pay | Admitting: Nurse Practitioner

## 2020-04-03 ENCOUNTER — Ambulatory Visit (INDEPENDENT_AMBULATORY_CARE_PROVIDER_SITE_OTHER): Payer: Medicare Other | Admitting: Nurse Practitioner

## 2020-04-03 VITALS — BP 120/64 | HR 94 | Temp 98.4°F | Ht 68.0 in | Wt 154.0 lb

## 2020-04-03 DIAGNOSIS — I10 Essential (primary) hypertension: Secondary | ICD-10-CM | POA: Diagnosis not present

## 2020-04-03 DIAGNOSIS — E785 Hyperlipidemia, unspecified: Secondary | ICD-10-CM

## 2020-04-03 NOTE — Patient Instructions (Addendum)
Your blood pressure was very good today at 120/64.  Continue with current therapy.   Your laboratory studies are up-to-date.  If you get right leg pain often, please let me know and I will give you a referral to [facility] specialists since that is the problem leg you varicose veins removed.   Follow-up office visit in 3 months for repeat lab check.

## 2020-04-03 NOTE — Progress Notes (Signed)
Established Patient Office Visit  Subjective:  Patient ID: Breanna Hodge, female    DOB: 1941/05/22  Age: 79 y.o. MRN: [id number]  CC:  Chief Complaint  Patient presents with  . Follow-up    HPI Breanna Hodge is an 79 yo right-handed with history of hypertension, hyperlipidemia, hyperglycemia, who establish care in March.  She returns for repeat follow-up visit.  She just had a cystocele and rectocele with complete uterovaginal prolapse repaired 03/24/2020.  She is doing quite well.  Very pleased.  Her colonoscopy is also up-to-date.    HTN: Presents on Zestoretic 20/25 mg 1 tablet daily, BP has been doing very well.  She has had no chest pain, edema, shortness of breath or DOE. BP Readings from Last 3 Encounters:  04/03/20 120/64  04/01/20 (!) 171/98  03/26/20 134/67    HLD: Presents on atorvastatin 10 mg daily, red yeast rice 600 mg tablet, Krill oil 1000 mg 1 daily in the evening, coenzyme Q 1000 mg daily. Lab Results  Component Value Date   CHOL 171 11/27/2019   HDL 62.70 11/27/2019   LDLCALC 90 11/27/2019   LDLDIRECT 189.9 05/09/2012   TRIG 93.0 11/27/2019   CHOLHDL 3 11/27/2019    Lab Results  Component Value Date   CHOL 171 11/27/2019   HDL 62.70 11/27/2019   LDLCALC 90 11/27/2019   LDLDIRECT 189.9 05/09/2012   TRIG 93.0 11/27/2019   CHOLHDL 3 11/27/2019     Past Medical History:  Diagnosis Date  . Anemia   . Arthritis   . Clotting disorder (HCC)    DVT, RLE during pregnancy  . Complication of anesthesia 1976   woke up during the procedure  . DLE (discoid lupus erythematosus)    diagnosed at [facility] by skin biopsy  . DVT (deep venous thrombosis) (HCC)    RLE, associated with pregnancy  . H/O: Bell's palsy    approx 15 yrs ago, resolved  . Heart murmur   . Hyperlipidemia   . Hypertension   . Rash    recurring on RLE, reported as discoid lupus by patient per prior [facility] eval  . Wears dentures    Full upper and lower. Does NOT wear  lower.    Past Surgical History:  Procedure Laterality Date  . ABDOMINAL HYSTERECTOMY  1985   with bladder tack using mesh  . ANTERIOR AND POSTERIOR REPAIR Bilateral 03/24/2020   Procedure: ANTERIOR (CYSTOCELE) AND POSTEROR REPAIR (RECTOCELE);  Surgeon: Harlin Heys, MD;  Location: [facility] OR[facility];  Service: Gynecology;  Laterality: Bilateral;  . CATARACT EXTRACTION W/PHACO Left 07/14/2015   Procedure: CATARACT EXTRACTION PHACO AND INTRAOCULAR LENS PLACEMENT (IOC);  Surgeon: Ronnell Freshwater, MD;  Location: [place];  Service: Ophthalmology;  Laterality: Left;  PER DR OFFICE PT WANTS EARLY AS POSSIBLE  . CATARACT EXTRACTION W/PHACO Right 08/25/2015   Procedure: CATARACT EXTRACTION PHACO AND INTRAOCULAR LENS PLACEMENT (IOC);  Surgeon: Ronnell Freshwater, MD;  Location: [place];  Service: Ophthalmology;  Laterality: Right;  PER DR OFFICE PT WANTS AS EARLY AS POSSIBLE  . CHOLECYSTECTOMY  1987  . COLONOSCOPY WITH PROPOFOL N/A 12/12/2019   Procedure: COLONOSCOPY WITH PROPOFOL;  Surgeon: Lin Landsman, MD;  Location: [facility];  Service: Gastroenterology;  Laterality: N/A;  . EYE SURGERY    . gyn surgery  1989   hysterectomy  . KNEE ARTHROSCOPY  2009   right  . KNEE ARTHROSCOPY  2007   right knee  . vein removal  2019  Right leg    Family History  Problem Relation Age of Onset  . Cancer Father        leukemia  . Coronary artery disease Mother        CABG  . Heart disease Brother        "heart problems"  . Lupus Daughter     Social History   Socioeconomic History  . Marital status: Widowed    Spouse name: Not on file  . Number of children: Not on file  . Years of education: Not on file  . Highest education level: Not on file  Occupational History  . Occupation: retired     Comment: as Radio broadcast assistant for [employer]  . Smoking status: Former Smoker    Packs/day: 4.00    Years: 6.00    Pack years: 24.00    Types:  Cigarettes    Quit date: 05/25/1960    Years since quitting: 59.9  . Smokeless tobacco: Never Used  . Tobacco comment: Quit over 46 years ago  Vaping Use  . Vaping Use: Never used  Substance and Sexual Activity  . Alcohol use: No  . Drug use: Never  . Sexual activity: Not on file  Other Topics Concern  . Not on file  Social History Narrative  . Not on file   Social Determinants of Health   Financial Resource Strain:   . Difficulty of Paying Living Expenses:   Food Insecurity:   . Worried About Charity fundraiser in the Last Year:   . Arboriculturist in the Last Year:   Transportation Needs:   . Film/video editor (Medical):   Marland Kitchen Lack of Transportation (Non-Medical):   Physical Activity:   . Days of Exercise per Week:   . Minutes of Exercise per Session:   Stress:   . Feeling of Stress :   Social Connections:   . Frequency of Communication with Friends and Family:   . Frequency of Social Gatherings with Friends and Family:   . Attends Religious Services:   . Active Member of Clubs or Organizations:   . Attends Archivist Meetings:   Marland Kitchen Marital Status:   Intimate Partner Violence:   . Fear of Current or Ex-Partner:   . Emotionally Abused:   Marland Kitchen Physically Abused:   . Sexually Abused:     Outpatient Medications Prior to Visit  Medication Sig Dispense Refill  . ascorbic acid (VITAMIN C) 500 MG tablet Take 500 mg by mouth daily.    Marland Kitchen atorvastatin (LIPITOR) 10 MG tablet Take 1 tablet (10 mg total) by mouth daily. 90 tablet 1  . BLACK ELDERBERRY PO Take 1,000 mg by mouth daily.     . Calcium-Magnesium-Vitamin D (CALCIUM 1200+D3 PO) Take 1 tablet by mouth daily.    . Cholecalciferol (VITAMIN D3) 50 MCG (2000 UT) TABS Take by mouth.    . Coenzyme Q10 (COQ-10) 100 MG CAPS Take 100 mg by mouth daily.    Marland Kitchen GARLIC PO Take 9,767 mg by mouth daily.    [name] Oil 1000 MG CAPS Take 1,000 mg by mouth every evening.    . Lactobacillus (DIGESTIVE HEALTH PROBIOTIC PO)  Take 1 capsule by mouth daily.    Marland Kitchen lisinopril-hydrochlorothiazide (ZESTORETIC) 20-25 MG tablet Take 1 tablet by mouth daily. (Patient taking differently: Take 1 tablet by mouth at bedtime. ) 90 tablet 1  . Magnesium 500 MG CAPS Take 500 mg by mouth daily.    Marland Kitchen  Misc Natural Products (OSTEO BI-FLEX JOINT SHIELD PO) Take 1 tablet by mouth every evening.    . Multiple Vitamin (MULTIVITAMIN WITH MINERALS) TABS tablet Take 1 tablet by mouth daily. Centrum Silver    . Red Yeast Rice 600 MG CAPS Take 1,200 mg by mouth daily in the afternoon.    . Zinc 50 MG CAPS Take 50 mg by mouth daily.    Marland Kitchen aspirin EC 81 MG tablet Take 81 mg by mouth every Monday, Wednesday, and Friday. Swallow whole.    . Cholecalciferol (VITAMIN D3) 1000 UNITS CAPS Take 1 capsule by mouth daily.     No facility-administered medications prior to visit.    Allergies  Allergen Reactions  . Naprosyn [Naproxen] Hives and Shortness Of Breath  . Rosuvastatin Calcium Other (See Comments)    Pt states loss of use on rt side    . Niacin And Related Rash    Review of Systems  Constitutional: Negative for chills and fever.  HENT: Negative.   Eyes: Negative.   Respiratory: Negative for cough and shortness of breath.   Cardiovascular: Negative for chest pain, palpitations and leg swelling.  Gastrointestinal: Negative.   Genitourinary: Negative.  Negative for difficulty urinating, dysuria, frequency and pelvic pain.       Healing very well from recent rectocele cystocele surgical repair last month.  Musculoskeletal:       She gets a right leg ache x1-2 per year ever since she had a "dead vein" removed form her right leg in [place].  She wears support stockings over the last 3 years.  This seems to help.  She has no active complaints.  Allergic/Immunologic: Negative.   Psychiatric/Behavioral: Negative.       Objective:    Physical Exam Vitals reviewed.  Constitutional:      Appearance: Normal appearance.  Eyes:     Pupils:  Pupils are equal, round, and reactive to light.  Cardiovascular:     Rate and Rhythm: Normal rate and regular rhythm.     Pulses: Normal pulses.     Heart sounds: Normal heart sounds.  Pulmonary:     Effort: Pulmonary effort is normal.     Breath sounds: Normal breath sounds.  Abdominal:     Palpations: Abdomen is soft.     Tenderness: There is no abdominal tenderness.  Musculoskeletal:        General: Normal range of motion.     Cervical back: Normal range of motion.  Skin:    General: Skin is warm and dry.  Neurological:     General: No focal deficit present.     Mental Status: She is alert and oriented to person, place, and time.  Psychiatric:        Mood and Affect: Mood normal.        Behavior: Behavior normal.     BP 120/64 (BP Location: Left Arm, Patient Position: Sitting, Cuff Size: Normal)   Pulse 94   Temp 98.4 F (36.9 C) (Oral)   Ht 5\' 8"  (1.727 m)   Wt 154 lb (69.9 kg)   SpO2 98%   BMI 23.42 kg/m  Wt Readings from Last 3 Encounters:  04/03/20 154 lb (69.9 kg)  04/01/20 153 lb (69.4 kg)  03/24/20 150 lb (68 kg)     Health Maintenance Due  Topic Date Due  . TETANUS/TDAP  Never done  . DEXA SCAN  Never done    There are no preventive care reminders to display for this  patient.  Lab Results  Component Value Date   TSH 1.57 05/09/2012   Lab Results  Component Value Date   WBC 9.0 03/20/2020   HGB 14.5 03/20/2020   HCT 42.6 03/20/2020   MCV 92.0 03/20/2020   PLT 238 03/20/2020   Lab Results  Component Value Date   NA 135 01/01/2020   K 3.6 01/01/2020   CO2 27 01/01/2020   GLUCOSE 92 01/01/2020   BUN 18 01/01/2020   CREATININE 0.55 01/01/2020   BILITOT 0.3 11/27/2019   ALKPHOS 52 11/27/2019   AST 20 11/27/2019   ALT 14 11/27/2019   PROT 7.3 11/27/2019   ALBUMIN 4.1 11/27/2019   CALCIUM 10.2 01/01/2020   GFR 106.35 01/01/2020   Lab Results  Component Value Date   CHOL 171 11/27/2019   Lab Results  Component Value Date   HDL  62.70 11/27/2019   Lab Results  Component Value Date   LDLCALC 90 11/27/2019   Lab Results  Component Value Date   TRIG 93.0 11/27/2019   Lab Results  Component Value Date   CHOLHDL 3 11/27/2019   Lab Results  Component Value Date   HGBA1C 6.0 11/27/2019      Assessment & Plan:   Problem List Items Addressed This Visit      Cardiovascular and Mediastinum   Essential hypertension - Primary     Other   Hyperlipidemia    Your blood pressure was very good today at 120/64.  Continue with current therapy.   Your laboratory studies are up-to-date.  If you get right leg pain often, please let me know and I will give you a referral to Vein and Vascular specialists since that is the problem leg you varicose veins removed.   Follow-up office visit in 3 months for repeat lab check.  No orders of the defined types were placed in this encounter.   Follow-up: Return in about 3 months (around 07/04/2020).  This visit occurred during the SARS-CoV-2 public health emergency.  Safety protocols were in place, including screening questions prior to the visit, additional usage of staff PPE, and extensive cleaning of exam room while observing appropriate contact time as indicated for disinfecting solutions.    Denice Paradise, NP

## 2020-04-04 ENCOUNTER — Ambulatory Visit: Payer: Medicare Other

## 2020-04-06 ENCOUNTER — Encounter: Payer: Self-pay | Admitting: Nurse Practitioner

## 2020-05-06 ENCOUNTER — Ambulatory Visit (INDEPENDENT_AMBULATORY_CARE_PROVIDER_SITE_OTHER): Payer: Medicare Other | Admitting: Obstetrics and Gynecology

## 2020-05-06 ENCOUNTER — Encounter: Payer: Self-pay | Admitting: Obstetrics and Gynecology

## 2020-05-06 VITALS — BP 156/80 | HR 85 | Wt 153.0 lb

## 2020-05-06 DIAGNOSIS — R300 Dysuria: Secondary | ICD-10-CM

## 2020-05-06 DIAGNOSIS — Z9889 Other specified postprocedural states: Secondary | ICD-10-CM

## 2020-05-06 LAB — POCT URINALYSIS DIPSTICK
Bilirubin, UA: NEGATIVE
Blood, UA: NEGATIVE
Glucose, UA: NEGATIVE
Ketones, UA: NEGATIVE
Leukocytes, UA: NEGATIVE
Nitrite, UA: NEGATIVE
Protein, UA: NEGATIVE
Spec Grav, UA: 1.01 (ref 1.010–1.025)
Urobilinogen, UA: 0.2 E.U./dL
pH, UA: 6 (ref 5.0–8.0)

## 2020-05-06 NOTE — Progress Notes (Signed)
Pt present for 6 week post op visit.

## 2020-05-06 NOTE — Progress Notes (Signed)
HPI:      [name] is a 79 y.o. [id number] who LMP was No LMP recorded. Patient has had a hysterectomy.  Subjective:   She presents today 6 weeks from surgery.  She reports that nothing is falling out.  She does state that she has occasional urine loss but it is much improved from before.  She is inquiring about lifting heavier objects. She states that she is not straining with bowel movements but seems to alternate between constipation and loose stools.    Hx: The following portions of the patient's history were reviewed and updated as appropriate:             She  has a past medical history of Anemia, Arthritis, Clotting disorder ([place]), Complication of anesthesia (1976), DLE (discoid lupus erythematosus), DVT (deep venous thrombosis) ([place]), H/O: Bell's palsy, Heart murmur, Hyperlipidemia, Hypertension, Rash, and Wears dentures. She does not have any pertinent problems on file. She  has a past surgical history that includes gyn surgery (1989); Cholecystectomy (1987); Knee arthroscopy (2009); Knee arthroscopy (2007); Abdominal hysterectomy (1985); Cataract extraction w/PHACO (Left, 07/14/2015); Cataract extraction w/PHACO (Right, 08/25/2015); vein removal (2019); Eye surgery; Colonoscopy with propofol (N/A, 12/12/2019); and Anterior and posterior repair (Bilateral, 03/24/2020). Her family history includes Cancer in her father; Coronary artery disease in her mother; Heart disease in her brother; Lupus in her daughter. She  reports that she quit smoking about 59 years ago. Her smoking use included cigarettes. She has a 24.00 pack-year smoking history. She has never used smokeless tobacco. She reports that she does not drink alcohol and does not use drugs. She has a current medication list which includes the following prescription(s): ascorbic acid, atorvastatin, black elderberry, calcium-magnesium-vitamin d, vitamin d3, ZHY-86, garlic, krill oil, lactobacillus,  lisinopril-hydrochlorothiazide, magnesium, misc natural products, multivitamin with minerals, red yeast rice, and zinc. She is allergic to naprosyn [naproxen], rosuvastatin calcium, and niacin and related.       Review of Systems:  Review of Systems  Constitutional: Denied constitutional symptoms, night sweats, recent illness, fatigue, fever, insomnia and weight loss.  Eyes: Denied eye symptoms, eye pain, photophobia, vision change and visual disturbance.  Ears/Nose/Throat/Neck: Denied ear, nose, throat or neck symptoms, hearing loss, nasal discharge, sinus congestion and sore throat.  Cardiovascular: Denied cardiovascular symptoms, arrhythmia, chest pain/pressure, edema, exercise intolerance, orthopnea and palpitations.  Respiratory: Denied pulmonary symptoms, asthma, pleuritic pain, productive sputum, cough, dyspnea and wheezing.  Gastrointestinal: Denied, gastro-esophageal reflux, melena, nausea and vomiting.  Genitourinary: Denied genitourinary symptoms including symptomatic vaginal discharge, pelvic relaxation issues, and urinary complaints.  Musculoskeletal: Denied musculoskeletal symptoms, stiffness, swelling, muscle weakness and myalgia.  Dermatologic: Denied dermatology symptoms, rash and scar.  Neurologic: Denied neurology symptoms, dizziness, headache, neck pain and syncope.  Psychiatric: Denied psychiatric symptoms, anxiety and depression.  Endocrine: Denied endocrine symptoms including hot flashes and night sweats.   Meds:   Current Outpatient Medications on File Prior to Visit  Medication Sig Dispense Refill  . ascorbic acid (VITAMIN C) 500 MG tablet Take 500 mg by mouth daily.    Marland Kitchen atorvastatin (LIPITOR) 10 MG tablet Take 1 tablet (10 mg total) by mouth daily. 90 tablet 1  . BLACK ELDERBERRY PO Take 1,000 mg by mouth daily.     . Calcium-Magnesium-Vitamin D (CALCIUM 1200+D3 PO) Take 1 tablet by mouth daily.    . Cholecalciferol (VITAMIN D3) 50 MCG (2000 UT) TABS Take by  mouth.    . Coenzyme Q10 (COQ-10) 100 MG CAPS Take 100 mg  by mouth daily.    Marland Kitchen GARLIC PO Take 9,242 mg by mouth daily.    [name] Oil 1000 MG CAPS Take 1,000 mg by mouth every evening.    . Lactobacillus (DIGESTIVE HEALTH PROBIOTIC PO) Take 1 capsule by mouth daily.    Marland Kitchen lisinopril-hydrochlorothiazide (ZESTORETIC) 20-25 MG tablet Take 1 tablet by mouth daily. (Patient taking differently: Take 1 tablet by mouth at bedtime. ) 90 tablet 1  . Magnesium 500 MG CAPS Take 500 mg by mouth daily.    . Misc Natural Products (OSTEO BI-FLEX JOINT SHIELD PO) Take 1 tablet by mouth every evening.    . Multiple Vitamin (MULTIVITAMIN WITH MINERALS) TABS tablet Take 1 tablet by mouth daily. Centrum Silver    . Red Yeast Rice 600 MG CAPS Take 1,200 mg by mouth daily in the afternoon.    . Zinc 50 MG CAPS Take 50 mg by mouth daily.     No current facility-administered medications on file prior to visit.    Objective:     Vitals:   05/06/20 0954  BP: (!) 156/80  Pulse: 85     Abdomen: Soft.  Non-tender.  No masses.  No HSM.  Incision/s: Intact.  Healing well.  No erythema.  No drainage.    Pelvic:   Vulva: Normal appearance.  No lesions.  Vagina: No lesions or abnormalities noted. Incisions healing well.  Mesh covered well.  Support: Normal pelvic support.  Urethra No masses tenderness or scarring.  Meatus Normal size without lesions or prolapse.  Vag Cuff: Intact.  No lesions.  Anus: Normal exam.  No lesions.  Perineum: Normal exam.  No lesions.        Bimanual   Adnexae: No masses.  Non-tender to palpation.  Cuff: Negative for abnormality.      Assessment:    [id number] Patient Active Problem List   Diagnosis Date Noted  . Hyperlipidemia 04/03/2020  . 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 Postoperative state 03/24/2020  . Personal history of colonic polyps   . Varicose veins of both lower extremities with pain 05/15/2018  . Encounter for long-term  (current) use of other medications 05/26/2011  . Arthritis 05/26/2011  . 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 Essential hypertension 05/26/2011     1. Post-operative state     Patient doing well postop   Plan:            1.  Continue no heavy lifting.  2.  Patient may slowly resume normal activities.  3.  We will collect urine for UA today to make sure she does not have a bladder infection causing extra leakage.  We will contact her with any abnormal results. Orders No orders of the defined types were placed in this encounter.   No orders of the defined types were placed in this encounter.     F/U  Return in about 3 months (around 08/06/2020).  Finis Bud, M.D. 05/06/2020 10:19 AM

## 2020-05-06 NOTE — Addendum Note (Signed)
Addended by: Creig Hines on: 05/06/2020 10:28 AM   Modules accepted: Orders

## 2020-06-17 ENCOUNTER — Telehealth: Payer: Self-pay | Admitting: Nurse Practitioner

## 2020-06-17 NOTE — Telephone Encounter (Signed)
Patient stated she can not go to UC today and would prefer PCP office. Only appointment available is for tomorrow.

## 2020-06-17 NOTE — Telephone Encounter (Signed)
Pt woke up at 5am with dizziness. I transferred her to access nurse.

## 2020-06-17 NOTE — Telephone Encounter (Signed)
She needs ACUTE CARE visit now for severe dizziness with HTN. She needs Neuro exam.

## 2020-06-17 NOTE — Telephone Encounter (Signed)
---Caller states she has been experiencing severe dizziness since around 5 a.m. this morning. BP this morning 185/76, pulse was 75. Access nurse recommend See PCP within 24 Hours. Appointment was schedule for tomorrow.       [facility] RECORD AccessNurse Patient Name: Breanna Hodge Gender: Female DOB: 1941-03-01 Age: 79 Y 62 M 8 D Return Phone Number: [phone] (Primary) Address: City/State/Zip: [place] [address] Client [facility]  [facility] [place] Client Site [place] - Day Physician [facility] - PHYSICIAN, NOT LISTED- MD Contact Type Call Who Is Calling Patient / Member / Family / Caregiver Call Type Triage / Clinical Relationship To Patient Self Return Phone Number [phone] (Primary) Chief Complaint Dizziness Reason for Call Symptomatic / Request for [place] states she has been experiencing severe dizziness. Provider Denice Paradise Translation No Nurse Assessment Nurse: D'Heur Lucia Gaskins, RN, Adrienne Date/Time (Eastern Time): 06/17/2020 8:52:04 AM Confirm and document reason for call. If symptomatic, describe symptoms. ---Caller states she has been experiencing severe dizziness since around 5 a.m. this morning. BP this morning 185/76, pulse was 75. Has the patient had close contact with a person known or suspected to have the novel coronavirus illness OR traveled / lives in area with major community spread (including international travel) in the last 14 days from the onset of symptoms? * If Asymptomatic, screen for exposure and travel within the last 14 days. ---No Does the patient have any new or worsening symptoms? ---Yes Will a triage be completed? ---Yes Related visit to physician within the last 2 weeks? ---No Does the PT have any chronic conditions? (i.e. diabetes, asthma, this includes High risk factors for  pregnancy, etc.) ---Yes List chronic conditions. ---HTN, Is this a behavioral health or substance abuse call? ---No Guidelines Guideline Title Affirmed Question Affirmed Notes Nurse Date/Time (Eastern Time) Dizziness - Lightheadedness [1] MODERATE dizziness (e.g., interferes with normal activities) AND [2] has NOT been evaluated by physician for this (Exception: dizziness D'Heur Lucia Gaskins, RN, Vincente Liberty 06/17/2020 8:58:00 AM PLEASE NOTE: All timestamps contained within this report are represented as [place] Standard Time. CONFIDENTIALTY NOTICE: This fax transmission is intended only for the addressee. It contains information that is legally privileged, confidential or otherwise protected from use or disclosure. If you are not the intended recipient, you are strictly prohibited from reviewing, disclosing, copying using or disseminating any of this information or taking any action in reliance on or regarding this information. If you have received this fax in error, please notify us immediately by telephone so that we can arrange for its return to [place]. Phone: [phone], Toll-Free: [phone], Fax: [phone] Page: 2 of 2 Call Id: [id number] Guidelines Guideline Title Affirmed Question Affirmed Notes Nurse Date/Time Eilene Ghazi Time) caused by heat exposure, sudden standing, or poor fluid intake) Disp. Time Eilene Ghazi Time) Disposition Final User 06/17/2020 9:07:13 AM See PCP within 24 Hours Yes D'Heur Lucia Gaskins, RN, Vincente Liberty Caller Disagree/Comply Comply Caller Understands Yes PreDisposition Call Doctor Care Advice Given Per Guideline SEE PCP WITHIN 24 HOURS: * IF OFFICE WILL BE OPEN: You need to be examined within the next 24 hours. Call your doctor (or NP/PA) when the office opens and make an appointment. DRINK FLUIDS: * Drink several glasses of fruit juice, other clear fluids or water. * This will improve hydration and blood glucose. LIE DOWN AND REST: * Lie down with feet elevated for 1  hour. * This will improve circulation and increase  blood flow to the brain. CALL BACK IF: * Passes out (faints) * You become worse CARE ADVICE given per Dizziness (Adult) guideline. Referrals REFERRED TO PCP OFFICE

## 2020-06-18 ENCOUNTER — Encounter: Payer: Self-pay | Admitting: Internal Medicine

## 2020-06-18 ENCOUNTER — Ambulatory Visit: Payer: Medicare Other | Admitting: Family

## 2020-06-18 ENCOUNTER — Ambulatory Visit (INDEPENDENT_AMBULATORY_CARE_PROVIDER_SITE_OTHER): Payer: Medicare Other | Admitting: Internal Medicine

## 2020-06-18 ENCOUNTER — Other Ambulatory Visit: Payer: Self-pay

## 2020-06-18 VITALS — BP 120/72 | HR 61 | Temp 98.3°F | Ht 68.0 in | Wt 152.2 lb

## 2020-06-18 DIAGNOSIS — R42 Dizziness and giddiness: Secondary | ICD-10-CM

## 2020-06-18 DIAGNOSIS — T502X5A Adverse effect of carbonic-anhydrase inhibitors, benzothiadiazides and other diuretics, initial encounter: Secondary | ICD-10-CM

## 2020-06-18 DIAGNOSIS — E876 Hypokalemia: Secondary | ICD-10-CM

## 2020-06-18 DIAGNOSIS — R7301 Impaired fasting glucose: Secondary | ICD-10-CM | POA: Diagnosis not present

## 2020-06-18 LAB — CBC WITH DIFFERENTIAL/PLATELET
Basophils Absolute: 0.1 10*3/uL (ref 0.0–0.1)
Basophils Relative: 0.9 % (ref 0.0–3.0)
Eosinophils Absolute: 0.3 10*3/uL (ref 0.0–0.7)
Eosinophils Relative: 3.5 % (ref 0.0–5.0)
HCT: 43.1 % (ref 36.0–46.0)
Hemoglobin: 14.3 g/dL (ref 12.0–15.0)
Lymphocytes Relative: 28.2 % (ref 12.0–46.0)
Lymphs Abs: 2.3 10*3/uL (ref 0.7–4.0)
MCHC: 33.1 g/dL (ref 30.0–36.0)
MCV: 95 fl (ref 78.0–100.0)
Monocytes Absolute: 0.7 10*3/uL (ref 0.1–1.0)
Monocytes Relative: 8.8 % (ref 3.0–12.0)
Neutro Abs: 4.8 10*3/uL (ref 1.4–7.7)
Neutrophils Relative %: 58.6 % (ref 43.0–77.0)
Platelets: 237 10*3/uL (ref 150.0–400.0)
RBC: 4.53 Mil/uL (ref 3.87–5.11)
RDW: 13.9 % (ref 11.5–15.5)
WBC: 8.2 10*3/uL (ref 4.0–10.5)

## 2020-06-18 LAB — BASIC METABOLIC PANEL
BUN: 19 mg/dL (ref 6–23)
CO2: 31 mEq/L (ref 19–32)
Calcium: 10.4 mg/dL (ref 8.4–10.5)
Chloride: 97 mEq/L (ref 96–112)
Creatinine, Ser: 0.65 mg/dL (ref 0.40–1.20)
GFR: 87.6 mL/min (ref >60.00)
Glucose, Bld: 93 mg/dL (ref 70–99)
Potassium: 3.3 mEq/L — ABNORMAL LOW (ref 3.5–5.1)
Sodium: 137 mEq/L (ref 135–145)

## 2020-06-18 LAB — HEMOGLOBIN A1C: Hgb A1c MFr Bld: 6.2 % (ref 4.6–6.5)

## 2020-06-18 NOTE — Progress Notes (Signed)
Subjective:  Patient ID: Breanna Hodge, female    DOB: [birth year]  Age: 79 y.o. MRN: [id number]  CC: The primary encounter diagnosis was Dizziness. A diagnosis of Impaired fasting glucose was also pertinent to this visit.  HPI Breanna Hodge presents for evaluation following a brief dizzy spell yesterday  This visit occurred during the SARS-CoV-2 public health emergency.  Safety protocols were in place, including screening questions prior to the visit, additional usage of staff PPE, and extensive cleaning of exam room while observing appropriate contact time as indicated for disinfecting solutions.   79 yr old female, WIDOWED 5.5 YEARS, lives alone with her dog, presents after having an episode of dizziness that occurred yesterday morning and lasted several hours.  .    Woke up at 5:15 am , to go to bathroom  Felt dizzy and weak,  Room was not spinning.  Not short of breath, felt very weak , not presyncopal.  Used the bathroom, then went to the kitchen and ate some dehydrated pineapple,  Scrambled some eggs, rice cakes ,    started to feel better  By around 10 ,  Felt she had to use the walls of the apartment complex while walking the dog on the leash. No precedent diarrhea , no excessive urination .  Took BP at 5:45 1   177/ 75.  Which is unusual for her , usually 824-235 systolic.    Has been on a low carb diet for the past 3 years.   Rice cakes and pineapple  . Usually walks 3 miles daily with dog, up and down hill.s  Has been having shortness of breath and extreme weakness which was worked up by Dr. Rockey Situ    S/P ANTERIOR CYSTOCELE REPAIR JUNE 28  [place]   Outpatient Medications Prior to Visit  Medication Sig Dispense Refill  . ascorbic acid (VITAMIN C) 500 MG tablet Take 500 mg by mouth daily.    Marland Kitchen atorvastatin (LIPITOR) 10 MG tablet Take 1 tablet (10 mg total) by mouth daily. 90 tablet 1  . BLACK ELDERBERRY PO Take 1,000 mg by mouth daily.     .  Calcium-Magnesium-Vitamin D (CALCIUM 1200+D3 PO) Take 1 tablet by mouth daily.    . Cholecalciferol (VITAMIN D3) 50 MCG (2000 UT) TABS Take by mouth.    . Coenzyme Q10 (COQ-10) 100 MG CAPS Take 100 mg by mouth daily.    Marland Kitchen GARLIC PO Take 3,614 mg by mouth daily.    [name] Oil 1000 MG CAPS Take 1,000 mg by mouth every evening.    . Lactobacillus (DIGESTIVE HEALTH PROBIOTIC PO) Take 1 capsule by mouth daily.    Marland Kitchen lisinopril-hydrochlorothiazide (ZESTORETIC) 20-25 MG tablet Take 1 tablet by mouth daily. (Patient taking differently: Take 1 tablet by mouth at bedtime. ) 90 tablet 1  . Magnesium 500 MG CAPS Take 500 mg by mouth daily.    . Misc Natural Products (OSTEO BI-FLEX JOINT SHIELD PO) Take 1 tablet by mouth every evening.    . Multiple Vitamin (MULTIVITAMIN WITH MINERALS) TABS tablet Take 1 tablet by mouth daily. Centrum Silver    . Red Yeast Rice 600 MG CAPS Take 1,200 mg by mouth daily in the afternoon.    . Zinc 50 MG CAPS Take 50 mg by mouth daily.     No facility-administered medications prior to visit.    Review of Systems;  Patient denies headache, fevers, malaise, unintentional weight loss, skin rash, eye pain,  sinus congestion and sinus pain, sore throat, dysphagia,  hemoptysis , cough, dyspnea, wheezing, chest pain, palpitations, orthopnea, edema, abdominal pain, nausea, melena, diarrhea, constipation, flank pain, dysuria, hematuria, urinary  Frequency, nocturia, numbness, tingling, seizures,  Focal weakness, Loss of consciousness,  Tremor, insomnia, depression, anxiety, and suicidal ideation.      Objective:  BP 120/72   Pulse 61   Temp 98.3 F (36.8 C)   Ht 5\' 8"  (1.727 m)   Wt 152 lb 3.2 oz (69 kg)   SpO2 98%   BMI 23.14 kg/m   BP Readings from Last 3 Encounters:  06/18/20 120/72  05/06/20 (!) 156/80  04/03/20 120/64    Wt Readings from Last 3 Encounters:  06/18/20 152 lb 3.2 oz (69 kg)  05/06/20 153 lb (69.4 kg)  04/03/20 154 lb (69.9 kg)    General  appearance: alert, cooperative and appears stated age Ears: normal TM's and external ear canals both ears Throat: lips, mucosa, and tongue normal; teeth and gums normal Neck: no adenopathy, no carotid bruit, supple, symmetrical, trachea midline and thyroid not enlarged, symmetric, no tenderness/mass/nodules Back: symmetric, no curvature. ROM normal. No CVA tenderness. Lungs: clear to auscultation bilaterally Heart: regular rate and rhythm, S1, S2 normal, no murmur, click, rub or gallop Abdomen: soft, non-tender; bowel sounds normal; no masses,  no organomegaly Pulses: 2+ and symmetric Skin: Skin color, texture, turgor normal. No rashes or lesions Lymph nodes: Cervical, supraclavicular, and axillary nodes normal. Neuro: CNs 2-12 intact. No nystagmus.  DTRs 2+/4 in biceps, brachioradialis, patellars and achilles. Muscle strength 5/5 in upper and lower exremities. Fine resting tremor bilaterally both hands cerebellar function normal. Romberg slightly positive . Cannot stand on one foot for > 1 sec . Marland Kitchen  No pronator drift.   Gait normal.    Lab Results  Component Value Date   HGBA1C 6.0 11/27/2019    Lab Results  Component Value Date   CREATININE 0.55 01/01/2020   CREATININE 0.60 11/27/2019   CREATININE 0.6 05/09/2012    Lab Results  Component Value Date   WBC 9.0 03/20/2020   HGB 14.5 03/20/2020   HCT 42.6 03/20/2020   PLT 238 03/20/2020   GLUCOSE 92 01/01/2020   CHOL 171 11/27/2019   TRIG 93.0 11/27/2019   HDL 62.70 11/27/2019   LDLDIRECT 189.9 05/09/2012   LDLCALC 90 11/27/2019   ALT 14 11/27/2019   AST 20 11/27/2019   NA 135 01/01/2020   K 3.6 01/01/2020   CL 98 01/01/2020   CREATININE 0.55 01/01/2020   BUN 18 01/01/2020   CO2 27 01/01/2020   TSH 1.57 05/09/2012   HGBA1C 6.0 11/27/2019    No results found.  Assessment & Plan:   Problem List Items Addressed This Visit      Unprioritized   Dizziness - Primary    New onset, one episode , occurring during morning  wakeup routine.  No vertigo,  Headache, shortness of breath, palpitations, or chest pain.. home BP was elevated to 195 systolic; she is not orthostatic here today and did not have any symptoms this morning.  Checking lytes, Cr  a1c and CBC . B12 /folate deficiencies ruled out in  April and she takes MVIs so not likely . Neurologic exam normal except for slightly positive Romberg sign.  Ischemic workup negative Feb-May 2021       Relevant Orders   CBC with Differential/Platelet   Hemoglobin K9T   Basic metabolic panel    Other Visit Diagnoses  Impaired fasting glucose       Relevant Orders   Hemoglobin A1c      I am having Breanna Hodge maintain her BLACK ELDERBERRY PO, atorvastatin, lisinopril-hydrochlorothiazide, Lactobacillus (DIGESTIVE HEALTH PROBIOTIC PO), Calcium-Magnesium-Vitamin D (CALCIUM 1200+D3 PO), Magnesium, Misc Natural Products (OSTEO BI-FLEX JOINT SHIELD PO), Krill Oil, ascorbic acid, Red Yeast Rice, Zinc, Vitamin D3, GSP-32, GARLIC PO, and multivitamin with minerals.  No orders of the defined types were placed in this encounter.   There are no discontinued medications.  Follow-up: No follow-ups on file.   Crecencio Mc, MD

## 2020-06-18 NOTE — Assessment & Plan Note (Addendum)
New onset, one episode , occurring during morning wakeup routine.  No vertigo,  Headache, shortness of breath, palpitations, or chest pain.. home BP was elevated to 548 systolic; she is not orthostatic here today and did not have any symptoms this morning.  Checking lytes, Cr  a1c and CBC . B12 /folate deficiencies ruled out in  April and she takes MVIs so not likely . Neurologic exam normal except for slightly positive Romberg sign.  Ischemic workup negative Feb-May 2021

## 2020-06-18 NOTE — Patient Instructions (Signed)
Dizziness Dizziness is a common problem. It is a feeling of unsteadiness or light-headedness. You may feel like you are about to faint. Dizziness can lead to injury if you stumble or fall. Anyone can become dizzy, but dizziness is more common in older adults. This condition can be caused by a number of things, including medicines, dehydration, or illness. Follow these instructions at home: Eating and drinking  Drink enough fluid to keep your urine clear or pale yellow. This helps to keep you from becoming dehydrated. Try to drink more clear fluids, such as water.  Do not drink alcohol.  Limit your caffeine intake if told to do so by your health care provider. Check ingredients and nutrition facts to see if a food or beverage contains caffeine.  Limit your salt (sodium) intake if told to do so by your health care provider. Check ingredients and nutrition facts to see if a food or beverage contains sodium. Activity  Avoid making quick movements. ? Rise slowly from chairs and steady yourself until you feel okay. ? In the morning, first sit up on the side of the bed. When you feel okay, stand slowly while you hold onto something until you know that your balance is fine.  If you need to stand in one place for a long time, move your legs often. Tighten and relax the muscles in your legs while you are standing.  Do not drive or use heavy machinery if you feel dizzy.  Avoid bending down if you feel dizzy. Place items in your home so that they are easy for you to reach without leaning over. Lifestyle  Do not use any products that contain nicotine or tobacco, such as cigarettes and e-cigarettes. If you need help quitting, ask your health care provider.  Try to reduce your stress level by using methods such as yoga or meditation. Talk with your health care provider if you need help to manage your stress. General instructions  Watch your dizziness for any changes.  Take over-the-counter and  prescription medicines only as told by your health care provider. Talk with your health care provider if you think that your dizziness is caused by a medicine that you are taking.  Tell a friend or a family member that you are feeling dizzy. If he or she notices any changes in your behavior, have this person call your health care provider.  Keep all follow-up visits as told by your health care provider. This is important. Contact a health care provider if:  Your dizziness does not go away.  Your dizziness or light-headedness gets worse.  You feel nauseous.  You have reduced hearing.  You have new symptoms.  You are unsteady on your feet or you feel like the room is spinning. Get help right away if:  You vomit or have diarrhea and are unable to eat or drink anything.  You have problems talking, walking, swallowing, or using your arms, hands, or legs.  You feel generally weak.  You are not thinking clearly or you have trouble forming sentences. It may take a friend or family member to notice this.  You have chest pain, abdominal pain, shortness of breath, or sweating.  Your vision changes.  You have any bleeding.  You have a severe headache.  You have neck pain or a stiff neck.  You have a fever. These symptoms may represent a serious problem that is an emergency. Do not wait to see if the symptoms will go away. Get medical help   right away. Call your local emergency services (911 in the [place] Do not drive yourself to the hospital. Summary  Dizziness is a feeling of unsteadiness or light-headedness. This condition can be caused by a number of things, including medicines, dehydration, or illness.  Anyone can become dizzy, but dizziness is more common in older adults.  Drink enough fluid to keep your urine clear or pale yellow. Do not drink alcohol.  Avoid making quick movements if you feel dizzy. Monitor your dizziness for any changes. This information is not intended to  replace advice given to you by your health care provider. Make sure you discuss any questions you have with your health care provider. Document Revised: 09/16/2017 Document Reviewed: 10/16/2016 Elsevier Patient Education  2020 [employer]  

## 2020-06-21 DIAGNOSIS — E876 Hypokalemia: Secondary | ICD-10-CM | POA: Insufficient documentation

## 2020-06-21 MED ORDER — LISINOPRIL 20 MG PO TABS: 20.0000 mg | ORAL_TABLET | Freq: Every day | ORAL | 3 refills | Status: DC

## 2020-06-21 MED ORDER — POTASSIUM CHLORIDE CRYS ER 20 MEQ PO TBCR: 20.0000 meq | EXTENDED_RELEASE_TABLET | Freq: Every day | ORAL | 0 refills | Status: DC

## 2020-06-21 NOTE — Assessment & Plan Note (Signed)
Changing BP meds.  Stopping hctz and prescribing lisinopril only. Potassium supplement once daily  X 3 days

## 2020-06-21 NOTE — Addendum Note (Signed)
Addended by: Crecencio Mc on: 06/21/2020 04:14 PM   Modules accepted: Orders

## 2020-06-21 NOTE — Progress Notes (Signed)
Labs reviewed.  Her potassium is slightly low.  This is due to the hctz that she takes as part of her BP medication.  I recommend a change in medication to lisinopril ONLY,  and a few days of a potassium supplement.  Calling in both to her local pharmacy.  The hctz may have also been the cause of her dizziness if it was dehydrating her.

## 2020-06-27 ENCOUNTER — Telehealth: Payer: Self-pay | Admitting: Nurse Practitioner

## 2020-06-27 NOTE — Telephone Encounter (Signed)
Pt called and states that she needs to know if she needs to take her medication? Please advise before leaving today if possible

## 2020-06-27 NOTE — Telephone Encounter (Signed)
I spoke with patient and advised her to talk to her cardiologist about the dizziness and almost fainting. She had went ahead and took her medication when I talked to her so she will be done with the potassium today.

## 2020-06-27 NOTE — Telephone Encounter (Signed)
Patient states she was very dizzy this morning that she almost passed out. Patient states this comes out of the blue and its the first time its happened since her visit with Dr. Derrel Nip on 06/18/20. Patient is only taking lisinopril; Dr. Derrel Nip stopped the HCTZ. She started the potassium 3 days ago. Patient states theres no warnings when dizziness comes on. I know she saw Dr. Derrel Nip but do you have any insight on this or should I send to Dr. Derrel Nip?

## 2020-06-27 NOTE — Telephone Encounter (Signed)
Dr. Derrel Nip and I reviewed this call and last OV. Plan: Ask her to call her Cardiologist for advice - if almost passing out.

## 2020-06-27 NOTE — Telephone Encounter (Signed)
Pt stated that she is still having dizziness after the medication changes and wanted to know what needs to be done  Pt saw Dr. Derrel Nip on 9/22

## 2020-06-30 ENCOUNTER — Ambulatory Visit (INDEPENDENT_AMBULATORY_CARE_PROVIDER_SITE_OTHER): Payer: Medicare Other | Admitting: Cardiovascular Disease

## 2020-06-30 ENCOUNTER — Encounter: Payer: Self-pay | Admitting: Cardiovascular Disease

## 2020-06-30 ENCOUNTER — Telehealth: Payer: Self-pay | Admitting: Cardiovascular Disease

## 2020-06-30 ENCOUNTER — Other Ambulatory Visit: Payer: Self-pay

## 2020-06-30 ENCOUNTER — Ambulatory Visit (INDEPENDENT_AMBULATORY_CARE_PROVIDER_SITE_OTHER): Payer: Medicare Other

## 2020-06-30 VITALS — BP 160/80 | HR 78 | Ht 68.0 in | Wt 153.4 lb

## 2020-06-30 DIAGNOSIS — I447 Left bundle-branch block, unspecified: Secondary | ICD-10-CM

## 2020-06-30 DIAGNOSIS — R9431 Abnormal electrocardiogram [ECG] [EKG]: Secondary | ICD-10-CM | POA: Diagnosis not present

## 2020-06-30 DIAGNOSIS — E782 Mixed hyperlipidemia: Secondary | ICD-10-CM | POA: Diagnosis not present

## 2020-06-30 DIAGNOSIS — I10 Essential (primary) hypertension: Secondary | ICD-10-CM

## 2020-06-30 NOTE — Telephone Encounter (Signed)
Spoke with patient and confirmed her appointment with provider today. Instructed to arrive early at the [facility] to allow time to go through screening and registration. She verbalized understanding with no further questions at this time. Advised

## 2020-06-30 NOTE — Patient Instructions (Addendum)
Medication Instructions:  No changes  If you need a refill on your cardiac medications before your next appointment, please call your pharmacy.    Lab work: No new labs needed   If you have labs (blood work) drawn today and your tests are completely normal, you will receive your results only by: Marland Kitchen MyChart Message (if you have MyChart) OR . A paper copy in the mail If you have any lab test that is abnormal or we need to change your treatment, we will call you to review the results.   Testing/Procedures: Zio monitor for near syncope, dizzy epsiodes Your physician has recommended that you wear a Zio monitor. This monitor is a medical device that records the heart's electrical activity. Doctors most often use these monitors to diagnose arrhythmias. Arrhythmias are problems with the speed or rhythm of the heartbeat. The monitor is a small device applied to your chest. You can wear one while you do your normal daily activities. While wearing this monitor if you have any symptoms to push the button and record what you felt. Once you have worn this monitor for the period of time provider prescribed (Usually 14 days), you will return the monitor device in the postage paid box. Once it is returned they will download the data collected and provide [place] with a report which the provider will then review and we will call you with those results. Important tips:  1. Avoid showering during the first 24 hours of wearing the monitor. 2. Avoid excessive sweating to help maximize wear time. 3. Do not submerge the device, no hot tubs, and no swimming pools. 4. Keep any lotions or oils away from the patch. 5. After 24 hours you may shower with the patch on. Take brief showers with your back facing the shower head.  6. Do not remove patch once it has been placed because that will interrupt data and decrease adhesive wear time. 7. Push the button when you have any symptoms and write down what you were  feeling. 8. Once you have completed wearing your monitor, remove and place into box which has postage paid and place in your outgoing mailbox.  9. If for some reason you have misplaced your box then call our office and we can provide another box and/or mail it off for you.        Follow-Up: At [facility], you and your health needs are our priority.  As part of our continuing mission to provide you with exceptional heart care, we have created designated Provider Care Teams.  These Care Teams include your primary Cardiologist (physician) and Advanced Practice Providers (APPs -  Physician Assistants and Nurse Practitioners) who all work together to provide you with the care you need, when you need it.  . You will need a follow up appointment in 3 months  . Providers on your designated Care Team:   . Murray Hodgkins, NP . Christell Faith, PA-C . Marrianne Mood, PA-C  Any Other Special Instructions Will Be Listed Below (If Applicable).  COVID-19 Vaccine Information can be found at: ShippingScam.co.uk For questions related to vaccine distribution or appointments, please email vaccine@[email or web address] .[email or web address] or call [phone].

## 2020-06-30 NOTE — Telephone Encounter (Signed)
Patient complaining of dizziness and thinks it may be due to medication changes in potassium. PCP referred her to call cardiologist because this is not a typical side effect from the medication

## 2020-06-30 NOTE — Progress Notes (Signed)
Cardiology Office Note  Date:  06/30/2020   ID:  Breanna Hodge, DOB 1940/12/09, MRN [id number]  PCP:  Marval Regal, NP   Chief Complaint  Patient presents with  . OTHER    Dizziness. Meds reviewed verbally with pt.    HPI:  [name] often Breanna Hodge is a 79--year-old woman with past medical history of hyperlipidemia 4 yr of smoking, remote Left bundle branch block Vascular issues/varicose veins status post RLE GSV RFA on 07/18/2018. 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 Who presents for follow-up of her lower extreme edema, hypertension, hyperlipidemia  Prior clinic visit February 2021 At that time active, walking the dog Some fatigue, shortness of breath  On today's visit reports having some dizziness Thinks it might be due to changing her potassium  Got out of bed, dizzy, possible vertigo?, better when back to bed Also reports having earlier episode last Friday felt weak, dizzy lasted 15 min  BP elevated today, numbers elevated At home pressure: 127 to 150, avg 130s  HCTZ recently held past several days in the setting of low potassium Was given 3 days of supplemental potassium  Lab work reviewed Hemoglobin A1c 6.2 Potassium 3.3 Stable BMP  Echocardiogram May 2021, normal ejection fraction, no valve disease  Stress test February 2021 reviewed perfusion defect concerning for artifact apical region CT attenuation correction images with coronary calcification, aortic atherosclerosis  EKG personally reviewed by myself on todays visit Shows NSR LBBB rate 78 bpm  Other past medical history reviewed  LE arterial doppler 05/2018 No hemodynamically significant arterial disease in bilateral lower  Extremities.  EKG in care everywhere: no mention of LBBB  Stress test 03/2018 IMPRESSION 1. ABNORMAL STUDY. 2. SMALL ZONE OF MILD APICAL ISCHEMIA. 3. THIS STUDY IS REPORTED AT [facility] WITHOUT PATIENT   SYMPTOMS  OF CHEST PAIN, AND WITH NEGATIVE ST SEGMENT RESPONSE.  GATED STUDY 1. LEFT VENTRICULAR GATED STUDY DEMONSTRATES NORMAL LEFT VENTRICULAR  FUNCTION. 2. ESTIMATED LEFT VENTRICULAR EJECTION FRACTION IS 48%. 3. LEFT VENTRICULAR WALL MOTION IS NORMAL.    PMH:   has a past medical history of Anemia, Arthritis, Clotting disorder ([place]), Complication of anesthesia (1976), DLE (discoid lupus erythematosus), DVT (deep venous thrombosis) ([place]), H/O: Bell's palsy, Heart murmur, Hyperlipidemia, Hypertension, Rash, and Wears dentures.  PSH:    Past Surgical History:  Procedure Laterality Date  . ABDOMINAL HYSTERECTOMY  1985   with bladder tack using mesh  . ANTERIOR AND POSTERIOR REPAIR Bilateral 03/24/2020   Procedure: ANTERIOR (CYSTOCELE) AND POSTEROR REPAIR (RECTOCELE);  Surgeon: Harlin Heys, MD;  Location: [facility];  Service: Gynecology;  Laterality: Bilateral;  . CATARACT EXTRACTION W/PHACO Left 07/14/2015   Procedure: CATARACT EXTRACTION PHACO AND INTRAOCULAR LENS PLACEMENT (IOC);  Surgeon: Ronnell Freshwater, MD;  Location: [place];  Service: Ophthalmology;  Laterality: Left;  PER DR OFFICE PT WANTS EARLY AS POSSIBLE  . CATARACT EXTRACTION W/PHACO Right 08/25/2015   Procedure: CATARACT EXTRACTION PHACO AND INTRAOCULAR LENS PLACEMENT (IOC);  Surgeon: Ronnell Freshwater, MD;  Location: [place];  Service: Ophthalmology;  Laterality: Right;  PER DR OFFICE PT WANTS AS EARLY AS POSSIBLE  . CHOLECYSTECTOMY  1987  . COLONOSCOPY WITH PROPOFOL N/A 12/12/2019   Procedure: COLONOSCOPY WITH PROPOFOL;  Surgeon: Lin Landsman, MD;  Location: [facility];  Service: Gastroenterology;  Laterality: N/A;  . EYE SURGERY    . gyn surgery  1989   hysterectomy  . KNEE ARTHROSCOPY  2009  right  . KNEE ARTHROSCOPY  2007   right knee  . vein removal  2019   Right leg    Current Outpatient Medications  Medication Sig Dispense Refill  . ascorbic  acid (VITAMIN C) 500 MG tablet Take 500 mg by mouth daily.    Marland Kitchen atorvastatin (LIPITOR) 10 MG tablet Take 1 tablet (10 mg total) by mouth daily. 90 tablet 1  . BLACK ELDERBERRY PO Take 1,000 mg by mouth daily.     . Calcium-Magnesium-Vitamin D (CALCIUM 1200+D3 PO) Take 1 tablet by mouth daily.    . Cholecalciferol (VITAMIN D3) 50 MCG (2000 UT) TABS Take by mouth.    . Coenzyme Q10 (COQ-10) 100 MG CAPS Take 100 mg by mouth daily.    Marland Kitchen GARLIC PO Take 4,098 mg by mouth daily.    [name] Oil 1000 MG CAPS Take 1,000 mg by mouth every evening.    . Lactobacillus (DIGESTIVE HEALTH PROBIOTIC PO) Take 1 capsule by mouth daily.    Marland Kitchen lisinopril (ZESTRIL) 20 MG tablet Take 1 tablet (20 mg total) by mouth daily. 90 tablet 3  . Magnesium 500 MG CAPS Take 500 mg by mouth daily.    . Misc Natural Products (OSTEO BI-FLEX JOINT SHIELD PO) Take 1 tablet by mouth every evening.    . Multiple Vitamin (MULTIVITAMIN WITH MINERALS) TABS tablet Take 1 tablet by mouth daily. Centrum Silver    . Red Yeast Rice 600 MG CAPS Take 1,200 mg by mouth daily in the afternoon.    . Zinc 50 MG CAPS Take 50 mg by mouth daily.     No current facility-administered medications for this visit.     Allergies:   Naprosyn [naproxen], Rosuvastatin calcium, and Niacin and related   Social History:  The patient  reports that she quit smoking about 60 years ago. Her smoking use included cigarettes. She has a 24.00 pack-year smoking history. She has never used smokeless tobacco. She reports that she does not drink alcohol and does not use drugs.   Family History:   family history includes Cancer in her father; Coronary artery disease in her mother; Heart disease in her brother; Lupus in her daughter.    Review of Systems: Review of Systems  Constitutional: Negative.   HENT: Negative.   Respiratory: Negative.   Cardiovascular: Negative.   Gastrointestinal: Negative.   Musculoskeletal: Negative.   Neurological: Positive for  dizziness.  Psychiatric/Behavioral: Negative.   All other systems reviewed and are negative.    PHYSICAL EXAM: VS:  BP (!) 160/80 (BP Location: Left Arm, Patient Position: Sitting, Cuff Size: Normal)   Pulse 78   Ht 5\' 8"  (1.727 m)   Wt 153 lb 6 oz (69.6 kg)   SpO2 97%   BMI 23.32 kg/m  , BMI Body mass index is 23.32 kg/m. Constitutional:  oriented to person, place, and time. No distress.  HENT:  Head: Grossly normal Eyes:  no discharge. No scleral icterus.  Neck: No JVD, no carotid bruits  Cardiovascular: Regular rate and rhythm, 1/6 SEM RSB Pulmonary/Chest: Clear to auscultation bilaterally, no wheezes or rails Abdominal: Soft.  no distension.  no tenderness.  Musculoskeletal: Normal range of motion Neurological:  normal muscle tone. Coordination normal. No atrophy Skin: Skin warm and dry Psychiatric: normal affect, pleasant  Recent Labs: 11/27/2019: ALT 14 06/18/2020: BUN 19; Creatinine, Ser 0.65; Hemoglobin 14.3; Platelets 237.0; Potassium 3.3; Sodium 137    Lipid Panel Lab Results  Component Value Date   CHOL 171  11/27/2019   HDL 62.70 11/27/2019   LDLCALC 90 11/27/2019   TRIG 93.0 11/27/2019      Wt Readings from Last 3 Encounters:  06/30/20 153 lb 6 oz (69.6 kg)  06/18/20 152 lb 3.2 oz (69 kg)  05/06/20 153 lb (69.4 kg)      ASSESSMENT AND PLAN:  Problem List Items Addressed This Visit      Cardiology Problems   Essential hypertension - Primary   Hyperlipidemia    Other Visit Diagnoses    LBBB (left bundle branch block)       Relevant Orders   EKG 12-Lead     Dizziness, weakness Etiology unclear, 2 recent episodes concerning for arrhythmia given acute onset and acute termination Long discussion with her, Zio monitor placed to rule out cardiac arrhythmia -Recommend she continue to hold HCTZ for now, blood pressure reasonable despite elevation on today's visit which is likely anxiety  Hyperlipidemia Numbers well controlled, no medication changes  made   Total encounter time more than 25 minutes  Greater than 50% was spent in counseling and coordination of care with the patient   Signed, Esmond Plants, M.D., Ph.D. [place], [place]

## 2020-07-08 ENCOUNTER — Other Ambulatory Visit: Payer: Self-pay

## 2020-07-10 ENCOUNTER — Ambulatory Visit (INDEPENDENT_AMBULATORY_CARE_PROVIDER_SITE_OTHER): Payer: Medicare Other | Admitting: Nurse Practitioner

## 2020-07-10 ENCOUNTER — Other Ambulatory Visit: Payer: Self-pay

## 2020-07-10 ENCOUNTER — Encounter: Payer: Self-pay | Admitting: Nurse Practitioner

## 2020-07-10 VITALS — BP 130/74 | HR 92 | Temp 98.2°F | Ht 68.0 in | Wt 153.0 lb

## 2020-07-10 DIAGNOSIS — E876 Hypokalemia: Secondary | ICD-10-CM

## 2020-07-10 DIAGNOSIS — I10 Essential (primary) hypertension: Secondary | ICD-10-CM | POA: Diagnosis not present

## 2020-07-10 DIAGNOSIS — R5383 Other fatigue: Secondary | ICD-10-CM

## 2020-07-10 DIAGNOSIS — R42 Dizziness and giddiness: Secondary | ICD-10-CM

## 2020-07-10 LAB — BASIC METABOLIC PANEL
BUN: 15 mg/dL (ref 6–23)
CO2: 26 mEq/L (ref 19–32)
Calcium: 9.9 mg/dL (ref 8.4–10.5)
Chloride: 104 mEq/L (ref 96–112)
Creatinine, Ser: 0.61 mg/dL (ref 0.40–1.20)
GFR: 85.32 mL/min (ref >60.00)
Glucose, Bld: 94 mg/dL (ref 70–99)
Potassium: 4 mEq/L (ref 3.5–5.1)
Sodium: 139 mEq/L (ref 135–145)

## 2020-07-10 LAB — TSH: TSH: 1.71 u[IU]/mL (ref 0.35–4.50)

## 2020-07-10 NOTE — Progress Notes (Signed)
Established Patient Office Visit  Subjective:  Patient ID: Breanna Hodge, female    DOB: Feb 22, 1941  Age: 79 y.o. MRN: [id number]  CC:  Chief Complaint  Patient presents with  . Follow-up    hypertension/dizziness    HPI Breanna Hodge is an 79 yo who presents for a follow-up of dizziness.  HTN: Patient had been maintained on Zestoretic, was reporting dizziness changes.  She saw Dr. Derrel Nip on 06/18/2020.  Her HCTZ was discontinued due to slight low potassium.  She was given potassium supplementation.   he presents today on lisinopril 20 mg daily.  Blood pressure 130/74.  She is being worked up by cardiology at this time and does not have an explanation for her symptoms.  She is going to be wearing a arrhythmia monitor.   She has had 2 episodes of dizziness.  One time she got up from the bathroom, felt lightheaded and  dizzy stood for a few seconds and resolved.  A week later, she felt absolutely fine was taking her dog for a walk and went down a big hill going up another and she felt sudden onset of dizziness.  She had to hold onto the light post to keep from falling down.  She called her daughter who came to pick her up in 10 minutes.  The symptoms resolved.  She has had no recurrence.    She feels well today without any recent dizziness, chest pain shortness of breath or DOE.   No inner ear type fullness or pressure or URI symptoms.  No HA or vision changes. Saw eye doctor  6 mos ago no change in vision.   BP Readings from Last 3 Encounters:  07/10/20 130/74  06/30/20 (!) 160/80  06/18/20 120/72    Wt Readings from Last 3 Encounters:  07/10/20 153 lb (69.4 kg)  06/30/20 153 lb 6 oz (69.6 kg)  06/18/20 152 lb 3.2 oz (69 kg)    Past Medical History:  Diagnosis Date  . Anemia   . Arthritis   . Clotting disorder (H[facility])    DVT, RLE during pregnancy  . Complication of anesthesia 1976   woke up during the procedure  . DLE (discoid lupus erythematosus)     diagnosed at [facility] by skin biopsy  . DVT (deep venous thrombosis) (H[facility])    RLE, associated with pregnancy  . H/O: Bell's palsy    approx 15 yrs ago, resolved  . Heart murmur   . Hyperlipidemia   . Hypertension   . Rash    recurring on RLE, reported as discoid lupus by patient per prior [facility] eval  . Wears dentures    Full upper and lower. Does NOT wear lower.    Past Surgical History:  Procedure Laterality Date  . ABDOMINAL HYSTERECTOMY  1985   with bladder tack using mesh  . ANTERIOR AND POSTERIOR REPAIR Bilateral 03/24/2020   Procedure: ANTERIOR (CYSTOCELE) AND POSTEROR REPAIR (RECTOCELE);  Surgeon: Harlin Heys, MD;  Location: [facility] OR[facility];  Service: Gynecology;  Laterality: Bilateral;  . CATARACT EXTRACTION W/PHACO Left 07/14/2015   Procedure: CATARACT EXTRACTION PHACO AND INTRAOCULAR LENS PLACEMENT (IOC);  Surgeon: Ronnell Freshwater, MD;  Location: [place];  Service: Ophthalmology;  Laterality: Left;  PER DR OFFICE PT WANTS EARLY AS POSSIBLE  . CATARACT EXTRACTION W/PHACO Right 08/25/2015   Procedure: CATARACT EXTRACTION PHACO AND INTRAOCULAR LENS PLACEMENT (IOC);  Surgeon: Ronnell Freshwater, MD;  Location: [place];  Service: Ophthalmology;  Laterality: Right;  PER DR OFFICE PT WANTS AS EARLY AS POSSIBLE  . CHOLECYSTECTOMY  1987  . COLONOSCOPY WITH PROPOFOL N/A 12/12/2019   Procedure: COLONOSCOPY WITH PROPOFOL;  Surgeon: Lin Landsman, MD;  Location: [facility] ENDOSCOPY;  Service: Gastroenterology;  Laterality: N/A;  . EYE SURGERY    . gyn surgery  1989   hysterectomy  . KNEE ARTHROSCOPY  2009   right  . KNEE ARTHROSCOPY  2007   right knee  . vein removal  2019   Right leg    Family History  Problem Relation Age of Onset  . Cancer Father        leukemia  . Coronary artery disease Mother        CABG  . Heart disease Brother        "heart problems"  . Lupus Daughter     Social History   Socioeconomic History  . Marital status:  Widowed    Spouse name: Not on file  . Number of children: Not on file  . Years of education: Not on file  . Highest education level: Not on file  Occupational History  . Occupation: retired     Comment: as Radio broadcast assistant for [employer]  . Smoking status: Former Smoker    Packs/day: 4.00    Years: 6.00    Pack years: 24.00    Types: Cigarettes    Quit date: 05/25/1960    Years since quitting: 60.1  . Smokeless tobacco: Never Used  . Tobacco comment: Quit over 46 years ago  Vaping Use  . Vaping Use: Never used  Substance and Sexual Activity  . Alcohol use: No  . Drug use: Never  . Sexual activity: Not on file  Other Topics Concern  . Not on file  Social History Narrative  . Not on file   Social Determinants of Health   Financial Resource Strain:   . Difficulty of Paying Living Expenses: Not on file  Food Insecurity:   . Worried About Charity fundraiser in the Last Year: Not on file  . Ran Out of Food in the Last Year: Not on file  Transportation Needs:   . Lack of Transportation (Medical): Not on file  . Lack of Transportation (Non-Medical): Not on file  Physical Activity:   . Days of Exercise per Week: Not on file  . Minutes of Exercise per Session: Not on file  Stress:   . Feeling of Stress : Not on file  Social Connections:   . Frequency of Communication with Friends and Family: Not on file  . Frequency of Social Gatherings with Friends and Family: Not on file  . Attends Religious Services: Not on file  . Active Member of Clubs or Organizations: Not on file  . Attends Archivist Meetings: Not on file  . Marital Status: Not on file  Intimate Partner Violence:   . Fear of Current or Ex-Partner: Not on file  . Emotionally Abused: Not on file  . Physically Abused: Not on file  . Sexually Abused: Not on file    Outpatient Medications Prior to Visit  Medication Sig Dispense Refill  . ascorbic acid (VITAMIN C) 500 MG tablet Take 500 mg by  mouth daily.    Marland Kitchen atorvastatin (LIPITOR) 10 MG tablet Take 1 tablet (10 mg total) by mouth daily. 90 tablet 1  . BLACK ELDERBERRY PO Take 1,000 mg by mouth daily.     . Calcium-Magnesium-Vitamin D (CALCIUM 1200+D3 PO) Take 1  tablet by mouth daily.    . Cholecalciferol (VITAMIN D3) 50 MCG (2000 UT) TABS Take by mouth.    . Coenzyme Q10 (COQ-10) 100 MG CAPS Take 100 mg by mouth daily.    Marland Kitchen GARLIC PO Take 8,119 mg by mouth daily.    [name] Oil 1000 MG CAPS Take 1,000 mg by mouth every evening.    . Lactobacillus (DIGESTIVE HEALTH PROBIOTIC PO) Take 1 capsule by mouth daily.    Marland Kitchen lisinopril (ZESTRIL) 20 MG tablet Take 1 tablet (20 mg total) by mouth daily. 90 tablet 3  . Magnesium 500 MG CAPS Take 500 mg by mouth daily.    . Misc Natural Products (OSTEO BI-FLEX JOINT SHIELD PO) Take 1 tablet by mouth every evening.    . Multiple Vitamin (MULTIVITAMIN WITH MINERALS) TABS tablet Take 1 tablet by mouth daily. Centrum Silver    . Red Yeast Rice 600 MG CAPS Take 1,200 mg by mouth daily in the afternoon.    . Zinc 50 MG CAPS Take 50 mg by mouth daily.     No facility-administered medications prior to visit.    Allergies  Allergen Reactions  . Naprosyn [Naproxen] Hives and Shortness Of Breath  . Rosuvastatin Calcium Other (See Comments)    Pt states loss of use on rt side    . Niacin And Related Rash   Review of Systems Pertinent positives noted in history of present illness and otherwise negative.   Objective:    Physical Exam Vitals reviewed.  Constitutional:      Appearance: Normal appearance.  HENT:     Head: Normocephalic and atraumatic.  Eyes:     Conjunctiva/sclera: Conjunctivae normal.     Pupils: Pupils are equal, round, and reactive to light.  Cardiovascular:     Rate and Rhythm: Normal rate and regular rhythm.     Pulses: Normal pulses.     Heart sounds: Normal heart sounds.  Pulmonary:     Effort: Pulmonary effort is normal.     Breath sounds: Normal breath sounds.    Abdominal:     Palpations: Abdomen is soft.     Tenderness: There is no abdominal tenderness.  Musculoskeletal:        General: Normal range of motion.     Cervical back: Normal range of motion and neck supple.  Skin:    General: Skin is warm and dry.  Neurological:     General: No focal deficit present.     Mental Status: She is alert and oriented to person, place, and time.  Psychiatric:        Mood and Affect: Mood normal.        Behavior: Behavior normal.        Thought Content: Thought content normal.     BP 130/74 (BP Location: Left Arm, Patient Position: Sitting, Cuff Size: Normal)   Pulse 92   Temp 98.2 F (36.8 C) (Oral)   Ht 5\' 8"  (1.727 m)   Wt 153 lb (69.4 kg)   SpO2 98%   BMI 23.26 kg/m  Wt Readings from Last 3 Encounters:  07/10/20 153 lb (69.4 kg)  06/30/20 153 lb 6 oz (69.6 kg)  06/18/20 152 lb 3.2 oz (69 kg)    Health Maintenance Due  Topic Date Due  . DEXA SCAN  Never done    There are no preventive care reminders to display for this patient.  Lab Results  Component Value Date   TSH 1.71 07/10/2020  Lab Results  Component Value Date   WBC 8.2 06/18/2020   HGB 14.3 06/18/2020   HCT 43.1 06/18/2020   MCV 95.0 06/18/2020   PLT 237.0 06/18/2020   Lab Results  Component Value Date   NA 139 07/10/2020   K 4.0 07/10/2020   CO2 26 07/10/2020   GLUCOSE 94 07/10/2020   BUN 15 07/10/2020   CREATININE 0.61 07/10/2020   BILITOT 0.3 11/27/2019   ALKPHOS 52 11/27/2019   AST 20 11/27/2019   ALT 14 11/27/2019   PROT 7.3 11/27/2019   ALBUMIN 4.1 11/27/2019   CALCIUM 9.9 07/10/2020   GFR 85.32 07/10/2020   Lab Results  Component Value Date   CHOL 171 11/27/2019   Lab Results  Component Value Date   HDL 62.70 11/27/2019   Lab Results  Component Value Date   LDLCALC 90 11/27/2019   Lab Results  Component Value Date   TRIG 93.0 11/27/2019   Lab Results  Component Value Date   CHOLHDL 3 11/27/2019   Lab Results  Component Value  Date   HGBA1C 6.2 06/18/2020      Assessment & Plan:   Problem List Items Addressed This Visit      Cardiovascular and Mediastinum   Essential hypertension - Primary   Relevant Orders   Basic metabolic panel (Completed)     Other   Other fatigue   Relevant Orders   TSH (Completed)   Dizziness   Relevant Orders   TSH (Completed)   Hypokalemia   Relevant Orders   Basic metabolic panel (Completed)     No orders of the defined types were placed in this encounter.  Continue to follow-up with Dr. Rockey Situ for your hypertension and dizziness and work-up is in progress.  Your potassium was slightly low and you had discontinuation of HCTZ and a few days of potassium supplementation.  We will check your potassium level today with blood work.  Continue current B12 and vitamin D as your levels were good.  You have mentioned some fatigue, I will check your thyroid as it has not been looked at in several years.  Continue your healthy diet and exercise.  Please follow-up in 6 months.  Follow-up: Return in about 6 months (around 01/08/2021).   This visit occurred during the SARS-CoV-2 public health emergency.  Safety protocols were in place, including screening questions prior to the visit, additional usage of staff PPE, and extensive cleaning of exam room while observing appropriate contact time as indicated for disinfecting solutions.   Denice Paradise, NP

## 2020-07-10 NOTE — Patient Instructions (Addendum)
Continue to follow-up with Dr. Rockey Situ for your hypertension and dizziness and work-up is in progress.  Your potassium was slightly low and you had discontinuation of HCTZ and a few days of potassium supplementation.  We will check your potassium level today with blood work.  Continue current B12 and vitamin D as your levels were good.  You have mentioned some fatigue, I will check your thyroid as it has not been looked at in several years.  Continue your healthy diet and exercise.  Please follow-up in 6 months.

## 2020-07-13 ENCOUNTER — Encounter: Payer: Self-pay | Admitting: Nurse Practitioner

## 2020-07-14 ENCOUNTER — Telehealth: Payer: Self-pay

## 2020-07-14 NOTE — Telephone Encounter (Signed)
LMTCB

## 2020-07-14 NOTE — Telephone Encounter (Signed)
Patient aware of lab results.

## 2020-07-21 ENCOUNTER — Ambulatory Visit: Payer: Medicare Other | Attending: Internal Medicine

## 2020-07-21 NOTE — Progress Notes (Signed)
Requesting Moderna Booster, unable to do at this appointment  Mervyn Skeeters, RN

## 2020-08-06 ENCOUNTER — Telehealth: Payer: Self-pay | Admitting: Nurse Practitioner

## 2020-08-06 ENCOUNTER — Encounter: Payer: Self-pay | Admitting: Obstetrics and Gynecology

## 2020-08-06 ENCOUNTER — Other Ambulatory Visit: Payer: Self-pay

## 2020-08-06 ENCOUNTER — Ambulatory Visit (INDEPENDENT_AMBULATORY_CARE_PROVIDER_SITE_OTHER): Payer: Medicare Other | Admitting: Obstetrics and Gynecology

## 2020-08-06 VITALS — BP 145/83 | HR 76 | Ht 68.0 in | Wt 151.4 lb

## 2020-08-06 DIAGNOSIS — N8189 Other female genital prolapse: Secondary | ICD-10-CM | POA: Diagnosis not present

## 2020-08-06 NOTE — Progress Notes (Signed)
HPI:      [name] is a 79 y.o. [id number] who LMP was No LMP recorded. Patient has had a hysterectomy.  Subjective:   She presents today several months after her surgery.  She reports nothing is further falling out of the vagina.  She reports no problems with urination or urine loss.  Reports normal bowel movements.  Patient has had 2 episodes which sound hypoglycemic/syncopal.  She wore a Holter monitor for 2 weeks but does not think anything will come of that because she did not have an episode during that time.  She seems to have corrected this issue by carrying some candy with her and when she feels the problem coming on she eats a piece of the candy and it seems to resolve.  General eating habits discussed.    Hx: The following portions of the patient's history were reviewed and updated as appropriate:             She  has a past medical history of Anemia, Arthritis, Clotting disorder ([place]), Complication of anesthesia (1976), DLE (discoid lupus erythematosus), DVT (deep venous thrombosis) ([place]), H/O: Bell's palsy, Heart murmur, Hyperlipidemia, Hypertension, Rash, and Wears dentures. She does not have any pertinent problems on file. She  has a past surgical history that includes gyn surgery (1989); Cholecystectomy (1987); Knee arthroscopy (2009); Knee arthroscopy (2007); Abdominal hysterectomy (1985); Cataract extraction w/PHACO (Left, 07/14/2015); Cataract extraction w/PHACO (Right, 08/25/2015); vein removal (2019); Eye surgery; Colonoscopy with propofol (N/A, 12/12/2019); and Anterior and posterior repair (Bilateral, 03/24/2020). Her family history includes Cancer in her father; Coronary artery disease in her mother; Heart disease in her brother; Lupus in her daughter. She  reports that she quit smoking about 60 years ago. Her smoking use included cigarettes. She has a 24.00 pack-year smoking history. She has never used smokeless tobacco. She reports that she does not drink  alcohol and does not use drugs. She has a current medication list which includes the following prescription(s): ascorbic acid, atorvastatin, black elderberry, calcium-magnesium-vitamin d, vitamin d3, FIE-33, garlic, krill oil, lactobacillus, lisinopril, magnesium, misc natural products, multivitamin with minerals, red yeast rice, and zinc. She is allergic to naprosyn [naproxen], rosuvastatin calcium, and niacin and related.       Review of Systems:  Review of Systems  Constitutional: Denied constitutional symptoms, night sweats, recent illness, fatigue, fever, insomnia and weight loss.  Eyes: Denied eye symptoms, eye pain, photophobia, vision change and visual disturbance.  Ears/Nose/Throat/Neck: Denied ear, nose, throat or neck symptoms, hearing loss, nasal discharge, sinus congestion and sore throat.  Cardiovascular: Denied cardiovascular symptoms, arrhythmia, chest pain/pressure, edema, exercise intolerance, orthopnea and palpitations.  Respiratory: Denied pulmonary symptoms, asthma, pleuritic pain, productive sputum, cough, dyspnea and wheezing.  Gastrointestinal: Denied, gastro-esophageal reflux, melena, nausea and vomiting.  Genitourinary: Denied genitourinary symptoms including symptomatic vaginal discharge, pelvic relaxation issues, and urinary complaints.  Musculoskeletal: Denied musculoskeletal symptoms, stiffness, swelling, muscle weakness and myalgia.  Dermatologic: Denied dermatology symptoms, rash and scar.  Neurologic: Denied neurology symptoms, dizziness, headache, neck pain and syncope.  Psychiatric: Denied psychiatric symptoms, anxiety and depression.  Endocrine: Denied endocrine symptoms including hot flashes and night sweats.   Meds:   Current Outpatient Medications on File Prior to Visit  Medication Sig Dispense Refill  . ascorbic acid (VITAMIN C) 500 MG tablet Take 500 mg by mouth daily.    Marland Kitchen atorvastatin (LIPITOR) 10 MG tablet Take 1 tablet (10 mg total) by mouth  daily. 90 tablet 1  . BLACK ELDERBERRY  PO Take 1,000 mg by mouth daily.     . Calcium-Magnesium-Vitamin D (CALCIUM 1200+D3 PO) Take 1 tablet by mouth daily.    . Cholecalciferol (VITAMIN D3) 50 MCG (2000 UT) TABS Take by mouth.    . Coenzyme Q10 (COQ-10) 100 MG CAPS Take 100 mg by mouth daily.    Marland Kitchen GARLIC PO Take 6,962 mg by mouth daily.    [name] Oil 1000 MG CAPS Take 1,000 mg by mouth every evening.    . Lactobacillus (DIGESTIVE HEALTH PROBIOTIC PO) Take 1 capsule by mouth daily.    Marland Kitchen lisinopril (ZESTRIL) 20 MG tablet Take 1 tablet (20 mg total) by mouth daily. 90 tablet 3  . Magnesium 500 MG CAPS Take 500 mg by mouth daily.    . Misc Natural Products (OSTEO BI-FLEX JOINT SHIELD PO) Take 1 tablet by mouth every evening.    . Multiple Vitamin (MULTIVITAMIN WITH MINERALS) TABS tablet Take 1 tablet by mouth daily. Centrum Silver    . Red Yeast Rice 600 MG CAPS Take 1,200 mg by mouth daily in the afternoon.    . Zinc 50 MG CAPS Take 50 mg by mouth daily.     No current facility-administered medications on file prior to visit.          Objective:     Vitals:   08/06/20 0852  BP: (!) 145/83  Pulse: 76   Filed Weights   08/06/20 0852  Weight: 151 lb 6.4 oz (68.7 kg)                Assessment:    [id number] Patient Active Problem List   Diagnosis Date Noted  . Hypokalemia 06/21/2020  . Dizziness 06/18/2020  . Hyperlipidemia 04/03/2020  . 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 Postoperative state 03/24/2020  . Personal history of colonic polyps   . Varicose veins of both lower extremities with pain 05/15/2018  . Encounter for long-term (current) use of other medications 05/26/2011  . Other fatigue 05/26/2011  . Arthritis 05/26/2011  . 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 Essential hypertension 05/26/2011     1. Pelvic relaxation disorder     Patient doing well.  No further evidence of pelvic issues.   Plan:            1.  Patient  to return for annual examination. Orders No orders of the defined types were placed in this encounter.   No orders of the defined types were placed in this encounter.     F/U  Return for Annual Physical. I spent 21 minutes involved in the care of this patient preparing to see the patient by obtaining and reviewing her medical history (including labs, imaging tests and prior procedures), documenting clinical information in the electronic health record (EHR), counseling and coordinating care plans, writing and sending prescriptions, ordering tests or procedures and directly communicating with the patient by discussing pertinent items from her history and physical exam as well as detailing my assessment and plan as noted above so that she has an informed understanding.  All of her questions were answered.  Finis Bud, M.D. 08/06/2020 9:12 AM

## 2020-08-06 NOTE — Telephone Encounter (Signed)
Patient returned phone call, AWV scheduled.

## 2020-08-06 NOTE — Telephone Encounter (Signed)
Left message for patient to call back and schedule Medicare Annual Wellness Visit (AWV)  ° °This should be a telephone visit only=30 minutes. ° °No hx of AWV; please schedule at anytime with Denisa O'Brien-Blaney at [facility] ° ° °

## 2020-08-12 ENCOUNTER — Ambulatory Visit (INDEPENDENT_AMBULATORY_CARE_PROVIDER_SITE_OTHER): Payer: Medicare Other

## 2020-08-12 VITALS — Ht 68.0 in | Wt 151.0 lb

## 2020-08-12 DIAGNOSIS — Z0000 Encounter for general adult medical examination without abnormal findings: Secondary | ICD-10-CM

## 2020-08-12 NOTE — Patient Instructions (Addendum)
[name] , Thank you for taking time to come for your Medicare Wellness Visit. I appreciate your ongoing commitment to your health goals. Please review the following plan we discussed and let me know if I can assist you in the future.   These are the goals we discussed: Goals    . Maintain Healthy Lifestyle     Stay active Healthy diet       This is a list of the screening recommended for you and due dates:  Health Maintenance  Topic Date Due  . DEXA scan (bone density measurement)  Never done  . Tetanus Vaccine  07/11/2022  . Flu Shot  Completed  . COVID-19 Vaccine  Completed  . Pneumonia vaccines  Completed    Immunizations Immunization History  Administered Date(s) Administered  . Influenza Split 07/02/2011  . Influenza, High Dose Seasonal PF 06/30/2020  . Moderna SARS-COVID-2 Vaccination 11/19/2019, 12/18/2019  . Pneumococcal Conjugate-13 03/28/2005  . Pneumococcal Polysaccharide-23 03/28/2012  . Tdap 07/11/2012   Keep all routine maintenance appointments.   Follow up 01/08/21 @ 8:30  Follow up 08/15/20 @ 1:00  Advanced directives: Mailed per request.   Conditions/risks identified: none new.  Follow up in one year for your annual wellness visit.   Preventive Care 61 Years and Older, Female Preventive care refers to lifestyle choices and visits with your health care provider that can promote health and wellness. What does preventive care include?  A yearly physical exam. This is also called an annual well check.  Dental exams once or twice a year.  Routine eye exams. Ask your health care provider how often you should have your eyes checked.  Personal lifestyle choices, including:  Daily care of your teeth and gums.  Regular physical activity.  Eating a healthy diet.  Avoiding tobacco and drug use.  Limiting alcohol use.  Practicing safe sex.  Taking low-dose aspirin every day.  Taking vitamin and mineral supplements as recommended by  your health care provider. What happens during an annual well check? The services and screenings done by your health care provider during your annual well check will depend on your age, overall health, lifestyle risk factors, and family history of disease. Counseling  Your health care provider may ask you questions about your:  Alcohol use.  Tobacco use.  Drug use.  Emotional well-being.  Home and relationship well-being.  Sexual activity.  Eating habits.  History of falls.  Memory and ability to understand (cognition).  Work and work Statistician.  Reproductive health. Screening  You may have the following tests or measurements:  Height, weight, and BMI.  Blood pressure.  Lipid and cholesterol levels. These may be checked every 5 years, or more frequently if you are over 63 years old.  Skin check.  Lung cancer screening. You may have this screening every year starting at age 80 if you have a 30-pack-year history of smoking and currently smoke or have quit within the past 15 years.  Fecal occult blood test (FOBT) of the stool. You may have this test every year starting at age 109.  Flexible sigmoidoscopy or colonoscopy. You may have a sigmoidoscopy every 5 years or a colonoscopy every 10 years starting at age 64.  Hepatitis C blood test.  Hepatitis B blood test.  Sexually transmitted disease (STD) testing.  Diabetes screening. This is done by checking your blood sugar (glucose) after you have not eaten for a while (fasting). You may have this done every 1-3 years.  Bone density  scan. This is done to screen for osteoporosis. You may have this done starting at age 67.  Mammogram. This may be done every 1-2 years. Talk to your health care provider about how often you should have regular mammograms. Talk with your health care provider about your test results, treatment options, and if necessary, the need for more tests. Vaccines  Your health care provider may  recommend certain vaccines, such as:  Influenza vaccine. This is recommended every year.  Tetanus, diphtheria, and acellular pertussis (Tdap, Td) vaccine. You may need a Td booster every 10 years.  Zoster vaccine. You may need this after age 65.  Pneumococcal 13-valent conjugate (PCV13) vaccine. One dose is recommended after age 6.  Pneumococcal polysaccharide (PPSV23) vaccine. One dose is recommended after age 80. Talk to your health care provider about which screenings and vaccines you need and how often you need them. This information is not intended to replace advice given to you by your health care provider. Make sure you discuss any questions you have with your health care provider. Document Released: 10/10/2015 Document Revised: 06/02/2016 Document Reviewed: 07/15/2015 Elsevier Interactive Patient Education  2017 [employer] Prevention in the Home Falls can cause injuries. They can happen to people of all ages. There are many things you can do to make your home safe and to help prevent falls. What can I do on the outside of my home?  Regularly fix the edges of walkways and driveways and fix any cracks.  Remove anything that might make you trip as you walk through a door, such as a raised step or threshold.  Trim any bushes or trees on the path to your home.  Use bright outdoor lighting.  Clear any walking paths of anything that might make someone trip, such as rocks or tools.  Regularly check to see if handrails are loose or broken. Make sure that both sides of any steps have handrails.  Any raised decks and porches should have guardrails on the edges.  Have any leaves, snow, or ice cleared regularly.  Use sand or salt on walking paths during winter.  Clean up any spills in your garage right away. This includes oil or grease spills. What can I do in the bathroom?  Use night lights.  Install grab bars by the toilet and in the tub and shower. Do not use towel  bars as grab bars.  Use non-skid mats or decals in the tub or shower.  If you need to sit down in the shower, use a plastic, non-slip stool.  Keep the floor dry. Clean up any water that spills on the floor as soon as it happens.  Remove soap buildup in the tub or shower regularly.  Attach bath mats securely with double-sided non-slip rug tape.  Do not have throw rugs and other things on the floor that can make you trip. What can I do in the bedroom?  Use night lights.  Make sure that you have a light by your bed that is easy to reach.  Do not use any sheets or blankets that are too big for your bed. They should not hang down onto the floor.  Have a firm chair that has side arms. You can use this for support while you get dressed.  Do not have throw rugs and other things on the floor that can make you trip. What can I do in the kitchen?  Clean up any spills right away.  Avoid walking on wet  floors.  Keep items that you use a lot in easy-to-reach places.  If you need to reach something above you, use a strong step stool that has a grab bar.  Keep electrical cords out of the way.  Do not use floor polish or wax that makes floors slippery. If you must use wax, use non-skid floor wax.  Do not have throw rugs and other things on the floor that can make you trip. What can I do with my stairs?  Do not leave any items on the stairs.  Make sure that there are handrails on both sides of the stairs and use them. Fix handrails that are broken or loose. Make sure that handrails are as long as the stairways.  Check any carpeting to make sure that it is firmly attached to the stairs. Fix any carpet that is loose or worn.  Avoid having throw rugs at the top or bottom of the stairs. If you do have throw rugs, attach them to the floor with carpet tape.  Make sure that you have a light switch at the top of the stairs and the bottom of the stairs. If you do not have them, ask someone to  add them for you. What else can I do to help prevent falls?  Wear shoes that:  Do not have high heels.  Have rubber bottoms.  Are comfortable and fit you well.  Are closed at the toe. Do not wear sandals.  If you use a stepladder:  Make sure that it is fully opened. Do not climb a closed stepladder.  Make sure that both sides of the stepladder are locked into place.  Ask someone to hold it for you, if possible.  Clearly mark and make sure that you can see:  Any grab bars or handrails.  First and last steps.  Where the edge of each step is.  Use tools that help you move around (mobility aids) if they are needed. These include:  Canes.  Walkers.  Scooters.  Crutches.  Turn on the lights when you go into a dark area. Replace any light bulbs as soon as they burn out.  Set up your furniture so you have a clear path. Avoid moving your furniture around.  If any of your floors are uneven, fix them.  If there are any pets around you, be aware of where they are.  Review your medicines with your doctor. Some medicines can make you feel dizzy. This can increase your chance of falling. Ask your doctor what other things that you can do to help prevent falls. This information is not intended to replace advice given to you by your health care provider. Make sure you discuss any questions you have with your health care provider. Document Released: 07/10/2009 Document Revised: 02/19/2016 Document Reviewed: 10/18/2014 Elsevier Interactive Patient Education  2017 [employer].

## 2020-08-12 NOTE — Progress Notes (Signed)
Subjective:   TALLIE DODDS is a 79 y.o. female who presents for an Initial Medicare Annual Wellness Visit.  Review of Systems    No ROS.  Medicare Wellness Virtual Visit.  Cardiac Risk Factors include: advanced age (>47men, >35 women);hypertension     Objective:    Today's Vitals   08/12/20 1304  Weight: 151 lb (68.5 kg)  Height: 5\' 8"  (1.727 m)   Body mass index is 22.96 kg/m.  Advanced Directives 08/12/2020 03/25/2020 03/17/2020 12/12/2019 08/25/2015 07/14/2015  Does Patient Have a Medical Advance Directive? Yes Yes Yes No Yes Yes  Type of Paramedic of [place];Living will - - - [place];Living will [place];Living will  Does patient want to make changes to medical advance directive? No - Patient declined No - Patient declined - - No - Patient declined -  Copy of [place] in Chart? No - copy requested - - - No - copy requested No - copy requested    Current Medications (verified) Outpatient Encounter Medications as of 08/12/2020  Medication Sig  . ascorbic acid (VITAMIN C) 500 MG tablet Take 500 mg by mouth daily.  Marland Kitchen atorvastatin (LIPITOR) 10 MG tablet Take 1 tablet (10 mg total) by mouth daily.  Marland Kitchen BLACK ELDERBERRY PO Take 1,000 mg by mouth daily.   . Calcium-Magnesium-Vitamin D (CALCIUM 1200+D3 PO) Take 1 tablet by mouth daily.  . Cholecalciferol (VITAMIN D3) 50 MCG (2000 UT) TABS Take by mouth.  . Coenzyme Q10 (COQ-10) 100 MG CAPS Take 100 mg by mouth daily.  Marland Kitchen GARLIC PO Take 8,756 mg by mouth daily.  [name] Oil 1000 MG CAPS Take 1,000 mg by mouth every evening.  . Lactobacillus (DIGESTIVE HEALTH PROBIOTIC PO) Take 1 capsule by mouth daily.  Marland Kitchen lisinopril (ZESTRIL) 20 MG tablet Take 1 tablet (20 mg total) by mouth daily.  . Magnesium 500 MG CAPS Take 500 mg by mouth daily.  . Misc Natural Products (OSTEO BI-FLEX JOINT SHIELD PO) Take 1 tablet by mouth every evening.  . Multiple  Vitamin (MULTIVITAMIN WITH MINERALS) TABS tablet Take 1 tablet by mouth daily. Centrum Silver  . Red Yeast Rice 600 MG CAPS Take 1,200 mg by mouth daily in the afternoon.  . Zinc 50 MG CAPS Take 50 mg by mouth daily.   No facility-administered encounter medications on file as of 08/12/2020.    Allergies (verified) Naprosyn [naproxen], Rosuvastatin calcium, and Niacin and related   History: Past Medical History:  Diagnosis Date  . Anemia   . Arthritis   . Clotting disorder (HCC)    DVT, RLE during pregnancy  . Complication of anesthesia 1976   woke up during the procedure  . DLE (discoid lupus erythematosus)    diagnosed at [facility] by skin biopsy  . DVT (deep venous thrombosis) (HCC)    RLE, associated with pregnancy  . H/O: Bell's palsy    approx 15 yrs ago, resolved  . Heart murmur   . Hyperlipidemia   . Hypertension   . Rash    recurring on RLE, reported as discoid lupus by patient per prior [facility] eval  . Wears dentures    Full upper and lower. Does NOT wear lower.   Past Surgical History:  Procedure Laterality Date  . ABDOMINAL HYSTERECTOMY  1985   with bladder tack using mesh  . ANTERIOR AND POSTERIOR REPAIR Bilateral 03/24/2020   Procedure: ANTERIOR (CYSTOCELE) AND POSTEROR REPAIR (RECTOCELE);  Surgeon: Jeannie Fend  Jeneen Rinks, MD;  Location: [facility] ORS;  Service: Gynecology;  Laterality: Bilateral;  . CATARACT EXTRACTION W/PHACO Left 07/14/2015   Procedure: CATARACT EXTRACTION PHACO AND INTRAOCULAR LENS PLACEMENT (IOC);  Surgeon: Ronnell Freshwater, MD;  Location: [place];  Service: Ophthalmology;  Laterality: Left;  PER DR OFFICE PT WANTS EARLY AS POSSIBLE  . CATARACT EXTRACTION W/PHACO Right 08/25/2015   Procedure: CATARACT EXTRACTION PHACO AND INTRAOCULAR LENS PLACEMENT (IOC);  Surgeon: Ronnell Freshwater, MD;  Location: [place];  Service: Ophthalmology;  Laterality: Right;  PER DR OFFICE PT WANTS AS EARLY AS POSSIBLE  . CHOLECYSTECTOMY  1987    . COLONOSCOPY WITH PROPOFOL N/A 12/12/2019   Procedure: COLONOSCOPY WITH PROPOFOL;  Surgeon: Lin Landsman, MD;  Location: [facility] ENDOSCOPY;  Service: Gastroenterology;  Laterality: N/A;  . EYE SURGERY    . gyn surgery  1989   hysterectomy  . KNEE ARTHROSCOPY  2009   right  . KNEE ARTHROSCOPY  2007   right knee  . vein removal  2019   Right leg   Family History  Problem Relation Age of Onset  . Cancer Father        leukemia  . Coronary artery disease Mother        CABG  . Heart disease Brother        "heart problems"  . Lupus Daughter    Social History   Socioeconomic History  . Marital status: Widowed    Spouse name: Not on file  . Number of children: Not on file  . Years of education: Not on file  . Highest education level: Not on file  Occupational History  . Occupation: retired     Comment: as Radio broadcast assistant for [employer]  . Smoking status: Former Smoker    Packs/day: 4.00    Years: 6.00    Pack years: 24.00    Types: Cigarettes    Quit date: 05/25/1960    Years since quitting: 60.2  . Smokeless tobacco: Never Used  . Tobacco comment: Quit over 46 years ago  Vaping Use  . Vaping Use: Never used  Substance and Sexual Activity  . Alcohol use: No  . Drug use: Never  . Sexual activity: Not on file  Other Topics Concern  . Not on file  Social History Narrative  . Not on file   Social Determinants of Health   Financial Resource Strain: Low Risk   . Difficulty of Paying Living Expenses: Not hard at all  Food Insecurity: No Food Insecurity  . Worried About Charity fundraiser in the Last Year: Never true  . Ran Out of Food in the Last Year: Never true  Transportation Needs: No Transportation Needs  . Lack of Transportation (Medical): No  . Lack of Transportation (Non-Medical): No  Physical Activity: Sufficiently Active  . Days of Exercise per Week: 7 days  . Minutes of Exercise per Session: 30 min  Stress: No Stress Concern Present  .  Feeling of Stress : Not at all  Social Connections: Unknown  . Frequency of Communication with Friends and Family: More than three times a week  . Frequency of Social Gatherings with Friends and Family: More than three times a week  . Attends Religious Services: Not on file  . Active Member of Clubs or Organizations: Not on file  . Attends Archivist Meetings: Not on file  . Marital Status: Not on file    Tobacco Counseling Counseling given: Not  Answered Comment: Quit over 46 years ago   Clinical Intake:  Pre-visit preparation completed: Yes        Diabetes: No  How often do you need to have someone help you when you read instructions, pamphlets, or other written materials from your doctor or pharmacy?: 1 - Never  Interpreter Needed?: No      Activities of Daily Living In your present state of health, do you have any difficulty performing the following activities: 08/12/2020 03/25/2020  Hearing? N N  Vision? N N  Comment - -  Difficulty concentrating or making decisions? N N  Walking or climbing stairs? N N  Dressing or bathing? N N  Doing errands, shopping? N N  Preparing Food and eating ? N -  Using the Toilet? N -  In the past six months, have you accidently leaked urine? N -  Do you have problems with loss of bowel control? N -  Managing your Medications? N -  Managing your Finances? N -  Housekeeping or managing your Housekeeping? N -  Some recent data might be hidden    Patient Care Team: Marval Regal, NP as PCP - General (Nurse Practitioner) Minna Merritts, MD as PCP - Cardiology (Cardiology) Christene Lye, MD as Consulting Physician (General Surgery)  Indicate any recent Medical Services you may have received from other than [facility] providers in the past year (date may be approximate).     Assessment:   This is a routine wellness examination for [facility].  I connected with Thanya today by telephone and verified that I am  speaking with the correct person using two identifiers. Location patient: home Location provider: work Persons participating in the virtual visit: patient, Marine scientist.    I discussed the limitations, risks, security and privacy concerns of performing an evaluation and management service by telephone and the availability of in person appointments. The patient expressed understanding and verbally consented to this telephonic visit.    Interactive audio and video telecommunications were attempted between this provider and patient, however failed, due to patient having technical difficulties OR patient did not have access to video capability.  We continued and completed visit with audio only.  Some vital signs may be absent or patient reported.   Hearing/Vision screen  Hearing Screening   125Hz  250Hz  500Hz  1000Hz  2000Hz  3000Hz  4000Hz  6000Hz  8000Hz   Right ear:           Left ear:           Comments: Patient is able to hear conversational tones without difficulty.  No issues reported.  Vision Screening Comments: Followed by [facility] Wears corrective lenses Visual acuity not assessed, virtual visit.  They have seen their ophthalmologist in the last 12 months.     Dietary issues and exercise activities discussed: Current Exercise Habits: Home exercise routine, Type of exercise: walking, Time (Minutes): 30, Frequency (Times/Week): 7, Weekly Exercise (Minutes/Week): 210, Intensity: Mild  Goals    . Maintain Healthy Lifestyle     Stay active Healthy diet      Depression Screen PHQ 2/9 Scores 08/12/2020 11/27/2019  PHQ - 2 Score 0 1  PHQ- 9 Score - 2    Fall Risk Fall Risk  08/12/2020 01/01/2020 11/27/2019  Falls in the past year? 0 1 0  Number falls in past yr: 0 0 0  Injury with Fall? - 0 0  Follow up Falls evaluation completed Falls evaluation completed Falls evaluation completed   Handrails in use when  climbing stairs? Yes Home free of loose throw rugs in walkways, pet beds,  electrical cords, etc? Yes  Adequate lighting in your home to reduce risk of falls? Yes   ASSISTIVE DEVICES UTILIZED TO PREVENT FALLS: Life alert? No  Use of a cane, walker or w/c? No  Grab bars in the bathroom? No  Shower chair or bench in shower? No  Elevated toilet seat or a handicapped toilet? No   TIMED UP AND GO: Was the test performed? No . Virtual visit.   Cognitive Function:    6CIT Screen 08/12/2020  What Year? 0 points  What month? 0 points  What time? 0 points  Count back from 20 0 points  Months in reverse 0 points  Repeat phrase 0 points  Total Score 0    Immunizations Immunization History  Administered Date(s) Administered  . Influenza Split 07/02/2011  . Influenza, High Dose Seasonal PF 06/30/2020  . Moderna SARS-COVID-2 Vaccination 11/19/2019, 12/18/2019  . Pneumococcal Conjugate-13 03/28/2005  . Pneumococcal Polysaccharide-23 03/28/2012  . Tdap 07/11/2012   Health Maintenance Health Maintenance  Topic Date Due  . DEXA SCAN  Never done  . TETANUS/TDAP  07/11/2022  . INFLUENZA VACCINE  Completed  . COVID-19 Vaccine  Completed  . PNA vac Low Risk Adult  Completed   Colonoscopy- 12/12/19  Mammogram status: No longer required.     Bone Density- plans to schedule with [facility].   Lung Cancer Screening: (Low Dose CT Chest recommended if Age 83-80 years, 30 pack-year currently smoking OR have quit w/in 15years.) does not qualify.   Hepatitis C Screening: does not qualify.  Vision Screening: Recommended annual ophthalmology exams for early detection of glaucoma and other disorders of the eye. Is the patient up to date with their annual eye exam?  Yes  Who is the provider or what is the name of the office in which the patient attends annual eye exams? [facility]  Dental Screening: Recommended annual dental exams for proper oral hygiene. Dentures.   Community Resource Referral / Chronic Care Management: CRR required this visit?  No   CCM  required this visit?  No      Plan:   Keep all routine maintenance appointments.   Follow ujp 08/15/20 @ 1:00; R foot, second toe pain  Follow up 01/08/21 @ 8:00  I have personally reviewed and noted the following in the patient's chart:   . Medical and social history . Use of alcohol, tobacco or illicit drugs  . Current medications and supplements . Functional ability and status . Nutritional status . Physical activity . Advanced directives . List of other physicians . Hospitalizations, surgeries, and ER visits in previous 12 months . Vitals . Screenings to include cognitive, depression, and falls . Referrals and appointments  In addition, I have reviewed and discussed with patient certain preventive protocols, quality metrics, and best practice recommendations. A written personalized care plan for preventive services as well as general preventive health recommendations were provided to patient via mail.     Varney Biles, LPN   03/49/1791

## 2020-08-15 ENCOUNTER — Ambulatory Visit: Payer: Medicare Other | Admitting: Nurse Practitioner

## 2020-08-15 DIAGNOSIS — Z0289 Encounter for other administrative examinations: Secondary | ICD-10-CM

## 2020-08-18 ENCOUNTER — Telehealth: Payer: Self-pay

## 2020-08-18 NOTE — Telephone Encounter (Signed)
Patient made aware of cardiac monitor results with verbalized understanding.  Patient is scheduled for an appt in Jan 2022 with Dr. Rockey Situ. She is doing well and would like to reschedule for April or May 2022

## 2020-08-18 NOTE — Telephone Encounter (Signed)
-----   Message from Minna Merritts, MD sent at 08/17/2020 11:56 AM EST ----- Event monitor Rare short runs of tachycardia (not triggered), longest 13 seconds, Rare extra beats (sometimes triggered) Nothing too alarming that would explain prior symptoms

## 2020-08-18 NOTE — Telephone Encounter (Signed)
Called to give the patient zio monitor results. [email or web address]. 

## 2020-08-18 NOTE — Telephone Encounter (Signed)
Patient calling to discuss recent testing results  ° °Please call  ° °

## 2020-08-28 ENCOUNTER — Other Ambulatory Visit: Payer: Self-pay

## 2020-08-28 ENCOUNTER — Ambulatory Visit (INDEPENDENT_AMBULATORY_CARE_PROVIDER_SITE_OTHER): Payer: Medicare Other | Admitting: Podiatry

## 2020-08-28 ENCOUNTER — Encounter: Payer: Self-pay | Admitting: Podiatry

## 2020-08-28 DIAGNOSIS — B351 Tinea unguium: Secondary | ICD-10-CM | POA: Diagnosis not present

## 2020-08-28 DIAGNOSIS — M2011 Hallux valgus (acquired), right foot: Secondary | ICD-10-CM | POA: Diagnosis not present

## 2020-08-28 DIAGNOSIS — M79674 Pain in right toe(s): Secondary | ICD-10-CM | POA: Diagnosis not present

## 2020-08-28 DIAGNOSIS — M2041 Other hammer toe(s) (acquired), right foot: Secondary | ICD-10-CM | POA: Insufficient documentation

## 2020-08-28 NOTE — Progress Notes (Signed)
This patient presents to the office with chief complaint of long thick painful nails second and third toenails right foot.  .  Patient says the nails are painful walking and wearing shoes when she walks for exercise.  This patient is unable to self treat.  This patient is unable to trim her nails since she is unable to reach her nails.  She presents to the office for preventative foot care services.  General Appearance  Alert, conversant and in no acute stress.  Vascular  Dorsalis pedis and posterior tibial  pulses are palpable  bilaterally.  Capillary return is within normal limits  bilaterally. Temperature is within normal limits  bilaterally.  Neurologic  Senn-Weinstein monofilament wire test within normal limits  bilaterally. Muscle power within normal limits bilaterally.  Nails Thick disfigured discolored nails with subungual debris  Second and third toenails right foot. . No evidence of bacterial infection or drainage bilaterally.  Orthopedic  No limitations of motion  feet .  No crepitus or effusions noted.  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  Skin  normotropic skin with no porokeratosis noted bilaterally.  No signs of infections or ulcers noted.     Onychomycosis  Nails  2,3 right foot..  Pain in right toes   IE  Debridement of nails 2,3 right foot   followed trimming the nails with dremel tool.  Discussed the option of nail surgery.    RTC prn.   Gardiner Barefoot DPM

## 2020-09-03 ENCOUNTER — Telehealth: Payer: Self-pay | Admitting: Nurse Practitioner

## 2020-09-03 NOTE — Telephone Encounter (Signed)
Patient called in for medication refill and ask who was going to be her doctor gave her the [facility] phone number to call and get est care

## 2020-09-16 ENCOUNTER — Ambulatory Visit
Admission: RE | Admit: 2020-09-16 | Discharge: 2020-09-16 | Disposition: A | Discharge status: Home / Self Care | Payer: Medicare Other | Source: Ambulatory Visit | Attending: Nurse Practitioner | Admitting: Nurse Practitioner

## 2020-09-16 ENCOUNTER — Other Ambulatory Visit: Payer: Self-pay

## 2020-09-16 DIAGNOSIS — Z780 Asymptomatic menopausal state: Secondary | ICD-10-CM | POA: Diagnosis not present

## 2020-09-22 ENCOUNTER — Other Ambulatory Visit: Payer: Self-pay

## 2020-09-22 MED ORDER — LISINOPRIL 20 MG PO TABS: 20.0000 mg | ORAL_TABLET | Freq: Every day | ORAL | 3 refills | Status: AC

## 2020-09-30 ENCOUNTER — Ambulatory Visit: Payer: Medicare Other | Admitting: Cardiovascular Disease

## 2020-10-27 ENCOUNTER — Other Ambulatory Visit: Payer: Self-pay | Admitting: Internal Medicine

## 2020-10-27 DIAGNOSIS — Z1231 Encounter for screening mammogram for malignant neoplasm of breast: Secondary | ICD-10-CM

## 2020-12-25 NOTE — Progress Notes (Signed)
Cardiology Office Note  Date:  12/29/2020   ID:  Breanna Hodge, DOB May 19, 1941, MRN [id number]  PCP:  Gladstone Lighter, MD   Chief Complaint  Patient presents with  . 3 month follow up     "doing well." Medications reviewed by the patient verbally.     HPI:  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 Who presents for follow-up of her lower extremity edema, hypertension, hyperlipidemia  Prior clinic visit oct 2021  Discussed recent events Was walking her dog, got weak, 07/2020 Friend had to pick her up, Was told "low sugar" Was not eating much at the time  An effort to keep her sugar up, Periodically takes tried prunes and pineapple when she has these episodes, seems to work well Breanna Hodge does not work Dizziness better if eating before  "faintness", better with food, dried fruit as above  Active, walking the dog "Dog is getting slow"  As pertains to her dizzy spells, monitor as below zio 10/21 results reviewed  Normal sinus rhythm avg HR of 71 bpm.   73 Supraventricular Tachycardia runs occurred, the run with the fastest interval lasting 7 beats with a max rate of 214 bpm, the longest lasting 13.2 secs with an avg rate of 124 bpm.  No patient triggers  Isolated SVEs were occasional (2.0%, 93267), SVE Couplets were rare (<1.0%, 454), and SVE Triplets were rare (<1.0%, 84).   Isolated VEs were occasional (1.7%, 22405), VE Couplets were rare (<1.0%, 27), and VE Triplets were rare (<1.0%, 1). Ventricular Bigeminy and Trigeminy were present.  Rare patient triggered events associated with PACs, PVCs  EKG personally reviewed by myself on todays visit Shows NSR LBBB rate 70 bpm  Other past medical history  reviewed  Echocardiogram May 2021, normal ejection fraction, no valve disease  Stress test February 2021 reviewed perfusion defect concerning for artifact apical region CT attenuation correction images with coronary calcification, aortic atherosclerosis  LE arterial doppler 05/2018 No hemodynamically significant arterial disease in bilateral lower  Extremities.  EKG in care everywhere: no mention of LBBB  Stress test 03/2018 IMPRESSION 1. ABNORMAL STUDY. 2. SMALL ZONE OF MILD APICAL ISCHEMIA. 3. THIS STUDY IS REPORTED AT [facility] WITHOUT PATIENT  SYMPTOMS  OF CHEST PAIN, AND WITH NEGATIVE ST SEGMENT RESPONSE.  GATED STUDY 1. LEFT VENTRICULAR GATED STUDY DEMONSTRATES NORMAL LEFT VENTRICULAR  FUNCTION. 2. ESTIMATED LEFT VENTRICULAR EJECTION FRACTION IS 48%. 3. LEFT VENTRICULAR WALL MOTION IS NORMAL.    PMH:   has a past medical history of Anemia, Arthritis, Clotting disorder ([place]), Complication of anesthesia (1976), DLE (discoid lupus erythematosus), DVT (deep venous thrombosis) ([place]), H/O: Bell's palsy, Heart murmur, Hyperlipidemia, Hypertension, Rash, and Wears dentures.  PSH:    Past Surgical History:  Procedure Laterality Date  . ABDOMINAL HYSTERECTOMY  1985   with bladder tack using mesh  . ANTERIOR AND POSTERIOR REPAIR Bilateral 03/24/2020   Procedure: ANTERIOR (CYSTOCELE) AND POSTEROR REPAIR (RECTOCELE);  Surgeon: Harlin Heys, MD;  Location: [facility] ORS;  Service: Gynecology;  Laterality: Bilateral;  . CATARACT EXTRACTION W/PHACO Left 07/14/2015   Procedure: CATARACT EXTRACTION PHACO AND INTRAOCULAR LENS PLACEMENT (IOC);  Surgeon: Ronnell Freshwater, MD;  Location: [place];  Service: Ophthalmology;  Laterality: Left;  PER DR OFFICE PT WANTS EARLY AS POSSIBLE  .  CATARACT EXTRACTION W/PHACO Right 08/25/2015   Procedure: CATARACT EXTRACTION PHACO AND INTRAOCULAR LENS PLACEMENT (IOC);  Surgeon: Ronnell Freshwater, MD;  Location:  [place];  Service: Ophthalmology;  Laterality: Right;  PER DR OFFICE PT WANTS AS EARLY AS POSSIBLE  . CHOLECYSTECTOMY  1987  . COLONOSCOPY WITH PROPOFOL N/A 12/12/2019   Procedure: COLONOSCOPY WITH PROPOFOL;  Surgeon: Lin Landsman, MD;  Location: [facility] ENDOSCOPY;  Service: Gastroenterology;  Laterality: N/A;  . EYE SURGERY    . gyn surgery  1989   hysterectomy  . KNEE ARTHROSCOPY  2009   right  . KNEE ARTHROSCOPY  2007   right knee  . vein removal  2019   Right leg    Current Outpatient Medications  Medication Sig Dispense Refill  . ascorbic acid (VITAMIN C) 500 MG tablet Take 500 mg by mouth daily.    Marland Kitchen atorvastatin (LIPITOR) 10 MG tablet Take 1 tablet (10 mg total) by mouth daily. 90 tablet 1  . BLACK ELDERBERRY PO Take 1,000 mg by mouth daily.     . Calcium-Magnesium-Vitamin D (CALCIUM 1200+D3 PO) Take 1 tablet by mouth daily.    . Cholecalciferol (VITAMIN D3) 50 MCG (2000 UT) TABS Take by mouth.    . Coenzyme Q10 (COQ-10) 100 MG CAPS Take 100 mg by mouth daily.    Marland Kitchen GARLIC PO Take 7,829 mg by mouth daily.    Breanna Hodge Oil 1000 MG CAPS Take 1,000 mg by mouth every evening.    . Lactobacillus (DIGESTIVE HEALTH PROBIOTIC PO) Take 1 capsule by mouth daily.    Marland Kitchen lisinopril (ZESTRIL) 20 MG tablet Take 1 tablet (20 mg total) by mouth daily. 90 tablet 3  . Magnesium 500 MG CAPS Take 500 mg by mouth daily.    . Misc Natural Products (OSTEO BI-FLEX JOINT SHIELD PO) Take 1 tablet by mouth every evening.    . Multiple Vitamin (MULTIVITAMIN WITH MINERALS) TABS tablet Take 1 tablet by mouth daily. Centrum Silver    . Red Yeast Rice 600 MG CAPS Take 1,200 mg by mouth daily in the afternoon.    . Zinc 50 MG CAPS Take 50 mg by mouth daily.     No current facility-administered medications for this visit.     Allergies:   Naprosyn [naproxen], Rosuvastatin calcium, and Niacin and related   Social History:  The patient  reports that she quit smoking about 60 years ago. Her  smoking use included cigarettes. She has a 24.00 pack-year smoking history. She has never used smokeless tobacco. She reports that she does not drink alcohol and does not use drugs.   Family History:   family history includes Cancer in her father; Coronary artery disease in her mother; Heart disease in her brother; Lupus in her daughter.    Review of Systems: Review of Systems  Constitutional: Negative.   HENT: Negative.   Respiratory: Negative.   Cardiovascular: Negative.   Gastrointestinal: Negative.   Musculoskeletal: Negative.   Neurological: Positive for dizziness.  Psychiatric/Behavioral: Negative.   All other systems reviewed and are negative.   PHYSICAL EXAM: VS:  BP (!) 142/82 (BP Location: Left Arm, Patient Position: Sitting, Cuff Size: Normal)   Pulse 70   Ht 5\' 8"  (1.727 m)   Wt 152 lb 2 oz (69 kg)   SpO2 98%   BMI 23.13 kg/m  , BMI Body mass index is 23.13 kg/m. Constitutional:  oriented to person, place, and time. No distress.  HENT:  Head: Grossly normal  Eyes:  no discharge. No scleral icterus.  Neck: No JVD, no carotid bruits  Cardiovascular: Regular rate and rhythm, no murmurs appreciated Pulmonary/Chest: Clear to auscultation bilaterally, no wheezes or rails Abdominal: Soft.  no distension.  no tenderness.  Musculoskeletal: Normal range of motion Neurological:  normal muscle tone. Coordination normal. No atrophy Skin: Skin warm and dry Psychiatric: normal affect, pleasant   Recent Labs: 06/18/2020: Hemoglobin 14.3; Platelets 237.0 07/10/2020: BUN 15; Creatinine, Ser 0.61; Potassium 4.0; Sodium 139; TSH 1.71    Lipid Panel Lab Results  Component Value Date   CHOL 171 11/27/2019   HDL 62.70 11/27/2019   LDLCALC 90 11/27/2019   TRIG 93.0 11/27/2019     Wt Readings from Last 3 Encounters:  12/29/20 152 lb 2 oz (69 kg)  08/12/20 151 lb (68.5 kg)  08/06/20 151 lb 6.4 oz (68.7 kg)     ASSESSMENT AND PLAN:  Problem List Items Addressed This  Visit      Cardiology Problems   Essential hypertension - Primary   Hyperlipidemia     Other   Dizziness    Other Visit Diagnoses    Lower extremity edema         Dizziness, weakness Off HCTZ, only on lisinopril Dizziness much better when eating before activity Takes dried fruits pineapples and pears when she has episodes Cardiac work-up  Hyperlipidemia Cholesterol is at goal on the current lipid regimen. No changes to the medications were made.  Lower extremity edema Stable, compression hose if symptoms get worse  Essential hypertension Continue lisinopril alone Will avoid overmedicating given her dizzy episodes   Total encounter time more than 25 minutes  Greater than 50% was spent in counseling and coordination of care with the patient   Signed, Esmond Plants, M.D., Ph.D. [place], [place]

## 2020-12-29 ENCOUNTER — Other Ambulatory Visit: Payer: Self-pay

## 2020-12-29 ENCOUNTER — Encounter: Payer: Self-pay | Admitting: Cardiovascular Disease

## 2020-12-29 ENCOUNTER — Ambulatory Visit (INDEPENDENT_AMBULATORY_CARE_PROVIDER_SITE_OTHER): Payer: Medicare Other | Admitting: Cardiovascular Disease

## 2020-12-29 VITALS — BP 142/82 | HR 70 | Ht 68.0 in | Wt 152.1 lb

## 2020-12-29 DIAGNOSIS — E782 Mixed hyperlipidemia: Secondary | ICD-10-CM | POA: Diagnosis not present

## 2020-12-29 DIAGNOSIS — R600 Localized edema: Secondary | ICD-10-CM | POA: Diagnosis not present

## 2020-12-29 DIAGNOSIS — R42 Dizziness and giddiness: Secondary | ICD-10-CM

## 2020-12-29 DIAGNOSIS — I10 Essential (primary) hypertension: Secondary | ICD-10-CM

## 2020-12-29 NOTE — Patient Instructions (Signed)
Medication Instructions:  No changes  If you need a refill on your cardiac medications before your next appointment, please call your pharmacy.    Lab work: No new labs needed   If you have labs (blood work) drawn today and your tests are completely normal, you will receive your results only by: . MyChart Message (if you have MyChart) OR . A paper copy in the mail If you have any lab test that is abnormal or we need to change your treatment, we will call you to review the results.   Testing/Procedures: No new testing needed   Follow-Up: At [facility], you and your health needs are our priority.  As part of our continuing mission to provide you with exceptional heart care, we have created designated Provider Care Teams.  These Care Teams include your primary Cardiologist (physician) and Advanced Practice Providers (APPs -  Physician Assistants and Nurse Practitioners) who all work together to provide you with the care you need, when you need it.  . You will need a follow up appointment in 12 months  . Providers on your designated Care Team:   . Christopher Berge, NP . Ryan Dunn, PA-C . Jacquelyn Visser, PA-C  Any Other Special Instructions Will Be Listed Below (If Applicable).  COVID-19 Vaccine Information can be found at: [email or web address] For questions related to vaccine distribution or appointments, please email [email or web address] or call [phone].     

## 2021-01-08 ENCOUNTER — Ambulatory Visit: Payer: Medicare Other | Admitting: Nurse Practitioner

## 2021-02-05 ENCOUNTER — Encounter: Payer: Self-pay | Admitting: Podiatry

## 2021-02-05 ENCOUNTER — Other Ambulatory Visit: Payer: Self-pay

## 2021-02-05 ENCOUNTER — Ambulatory Visit (INDEPENDENT_AMBULATORY_CARE_PROVIDER_SITE_OTHER): Payer: Medicare Other | Admitting: Podiatry

## 2021-02-05 DIAGNOSIS — M2041 Other hammer toe(s) (acquired), right foot: Secondary | ICD-10-CM

## 2021-02-05 NOTE — Progress Notes (Signed)
This patient presents to the office with chief complaint of long thick painful toes 2nd and 3rd right foot.  These toes become painful walking and wearing her shoes.  She says the pain in her toes is worsening.  She presents for evaluation and treatment.  General Appearance  Alert, conversant and in no acute stress.  Vascular  Dorsalis pedis and posterior tibial  pulses are palpable  bilaterally.  Capillary return is within normal limits  bilaterally. Temperature is within normal limits  bilaterally.  Neurologic  Senn-Weinstein monofilament wire test within normal limits  bilaterally. Muscle power within normal limits bilaterally.  Nails Thick disfigured discolored nails with subungual debris  Second and third toenails right foot. . No evidence of bacterial infection or drainage bilaterally.  Orthopedic  No limitations of motion  feet .  No crepitus or effusions noted.  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 Mallet toes 2,3 right foot.  Skin  normotropic skin with no porokeratosis noted bilaterally.  No signs of infections or ulcers noted.     Mallet toes  2,3 right foot  ROV  Discussed mallet toe deformity with this patient.   Discussed the option of nail surgery.    RTC 6 months   Gardiner Barefoot DPM

## 2021-08-13 ENCOUNTER — Ambulatory Visit: Payer: Medicare Other

## 2021-08-17 ENCOUNTER — Encounter (INDEPENDENT_AMBULATORY_CARE_PROVIDER_SITE_OTHER): Payer: Self-pay

## 2021-08-17 ENCOUNTER — Encounter: Payer: Self-pay | Admitting: Podiatry

## 2021-08-17 ENCOUNTER — Ambulatory Visit (INDEPENDENT_AMBULATORY_CARE_PROVIDER_SITE_OTHER): Payer: Medicare Other | Admitting: Podiatry

## 2021-08-17 ENCOUNTER — Other Ambulatory Visit: Payer: Self-pay

## 2021-08-17 DIAGNOSIS — B351 Tinea unguium: Secondary | ICD-10-CM | POA: Diagnosis not present

## 2021-08-17 DIAGNOSIS — M2041 Other hammer toe(s) (acquired), right foot: Secondary | ICD-10-CM

## 2021-08-17 DIAGNOSIS — M79674 Pain in right toe(s): Secondary | ICD-10-CM | POA: Diagnosis not present

## 2021-08-17 NOTE — Progress Notes (Signed)
This patient presents to the office with chief complaint of long thick painful toes 2nd and 3rd right foot.  These toes become painful walking and wearing her shoes.  She says the pain in her toes is worsening.  She presents for evaluation and treatment. Patient says her nails were painful for days after her last visit.  Patient has not seen a doctor for surgical consult.  General Appearance  Alert, conversant and in no acute stress.  Vascular  Dorsalis pedis and posterior tibial  pulses are palpable  bilaterally.  Capillary return is within normal limits  bilaterally. Temperature is within normal limits  bilaterally.  Neurologic  Senn-Weinstein monofilament wire test within normal limits  bilaterally. Muscle power within normal limits bilaterally.  Nails Thick disfigured discolored nails with subungual debris  Second and third toenails right foot. . No evidence of bacterial infection or drainage bilaterally.  Orthopedic  No limitations of motion  feet .  No crepitus or effusions noted.  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 Mallet toes 2,3 right foot. Flexible hammer toe third toe left foot.  Skin  normotropic skin with no porokeratosis noted bilaterally.  No signs of infections or ulcers noted.     Mallet toes  2,3 right foot  ROV  Discussed mallet toe deformity with this patient.   Discussed the option of nail surgery.    RTC 4 months   Gardiner Barefoot DPM

## 2021-09-09 ENCOUNTER — Ambulatory Visit (INDEPENDENT_AMBULATORY_CARE_PROVIDER_SITE_OTHER): Payer: Medicare Other

## 2021-09-09 ENCOUNTER — Encounter: Payer: Self-pay | Admitting: Podiatry

## 2021-09-09 ENCOUNTER — Ambulatory Visit (INDEPENDENT_AMBULATORY_CARE_PROVIDER_SITE_OTHER): Payer: Medicare Other | Admitting: Podiatry

## 2021-09-09 ENCOUNTER — Other Ambulatory Visit: Payer: Self-pay

## 2021-09-09 DIAGNOSIS — M2042 Other hammer toe(s) (acquired), left foot: Secondary | ICD-10-CM

## 2021-09-09 DIAGNOSIS — M2041 Other hammer toe(s) (acquired), right foot: Secondary | ICD-10-CM

## 2021-09-09 NOTE — Progress Notes (Signed)
She presents today on referral from Dr. Sharyon Cable to discuss hammertoe deformities 2 through 4 the right foot and 3 on the left foot.  She denies changes in her past medical history medications allergies surgery social history.  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 Sharyon Cable has taken care of in the past pulses are palpable.  She has digital deformities hammertoe deformities which are primarily arthritic at the PIPJ that we do have some motion.  She does have distal clavi noted on toe #2 number 3 on the right foot and #3 of the left foot.  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  Plan: She is to follow-up with [place] in a couple of weeks to have tenotomies performed to toes 2 through feet 3 on the right foot and #3 on the left foot.

## 2021-10-07 ENCOUNTER — Ambulatory Visit (INDEPENDENT_AMBULATORY_CARE_PROVIDER_SITE_OTHER): Payer: Medicare Other | Admitting: Podiatry

## 2021-10-07 ENCOUNTER — Encounter: Payer: Self-pay | Admitting: Podiatry

## 2021-10-07 ENCOUNTER — Other Ambulatory Visit: Payer: Self-pay

## 2021-10-07 DIAGNOSIS — M205X1 Other deformities of toe(s) (acquired), right foot: Secondary | ICD-10-CM | POA: Diagnosis not present

## 2021-10-07 DIAGNOSIS — M205X2 Other deformities of toe(s) (acquired), left foot: Secondary | ICD-10-CM | POA: Diagnosis not present

## 2021-10-07 DIAGNOSIS — M205X9 Other deformities of toe(s) (acquired), unspecified foot: Secondary | ICD-10-CM

## 2021-10-07 NOTE — Patient Instructions (Signed)
Leave bandage in place and dry for 4 days, then remove. You may wash foot normally after removal of bandage. DO NOT SOAK FOOT! Dry completely afterwards and may use a bandaid over incision if needed. We will follow up with you in 1 weeks for recheck.  

## 2021-10-07 NOTE — Progress Notes (Signed)
She presents today with painful toes bilaterally.  She would like to have her tenotomies performed today.  She is given it thought.  Objective: Vital signs are stable alert oriented x3.  Pulses are palpable.  Hammertoe deformities flexible in nature 2 3 and 4 on the right foot and #3 on the left foot all with distal clavi painful on palpation.  Assessment pain in limb secondary to hammertoe deformities.  Plan: Today we performed a surgical procedure for flexor tenotomy's to toes #2 3 and 4 of the right foot and #3 on the left foot.  These were localized with a total of 9 cc of a 50-50 mixture Marcaine plain and lidocaine with epi.  The area was prepped and draped in his normal sterile fashion.  Small stab tenotomy were performed on the plantar aspect of the underlying the PIPJ of each digit.  There was some bleeding but less than 5 cc.  The area was flushed with normal sterile saline and alcohol it was then dried and Dermabond was placed as well as a dry sterile compressive dressing.  I will follow-up with her in 1 week to reevaluate she is to keep these dry clean and elevated but we did place her in [place] shoes.

## 2021-10-14 ENCOUNTER — Ambulatory Visit (INDEPENDENT_AMBULATORY_CARE_PROVIDER_SITE_OTHER): Payer: Medicare Other | Admitting: Podiatry

## 2021-10-14 ENCOUNTER — Other Ambulatory Visit: Payer: Self-pay

## 2021-10-14 ENCOUNTER — Encounter: Payer: Self-pay | Admitting: Podiatry

## 2021-10-14 DIAGNOSIS — Z9889 Other specified postprocedural states: Secondary | ICD-10-CM

## 2021-10-14 DIAGNOSIS — M205X9 Other deformities of toe(s) (acquired), unspecified foot: Secondary | ICD-10-CM

## 2021-10-14 NOTE — Progress Notes (Signed)
She presents today for follow-up of her tenotomy's second third and fourth toes of the right foot third toes on the left foot states that second toe is sore on the right side.  Objective: There is no erythema cellulitis drainage or odor second toe on the left foot demonstrates more contracture than it did previously the third toe is rectus the wound to the third toe is still healing it has not completely scabbed over yet.  There is no erythema cellulitis drainage or odor.  Assessment well-healing surgical toes.  Plan: Encouraged her to keep the third toe left foot dressed with small amount of Neosporin plantarly and a loose Band-Aid.  She understands and is amenable to it we will follow-up with me in about 3 weeks for reevaluation.  May need to consider working on the second toe of the left foot.

## 2021-10-27 ENCOUNTER — Other Ambulatory Visit: Payer: Self-pay

## 2021-10-27 ENCOUNTER — Ambulatory Visit (INDEPENDENT_AMBULATORY_CARE_PROVIDER_SITE_OTHER): Payer: Medicare Other | Admitting: Cardiovascular Disease

## 2021-10-27 ENCOUNTER — Encounter: Payer: Self-pay | Admitting: Cardiovascular Disease

## 2021-10-27 ENCOUNTER — Telehealth: Payer: Self-pay | Admitting: Cardiovascular Disease

## 2021-10-27 VITALS — BP 140/70 | HR 83 | Ht 67.0 in | Wt 150.1 lb

## 2021-10-27 DIAGNOSIS — I83813 Varicose veins of bilateral lower extremities with pain: Secondary | ICD-10-CM

## 2021-10-27 DIAGNOSIS — I739 Peripheral vascular disease, unspecified: Secondary | ICD-10-CM

## 2021-10-27 DIAGNOSIS — I10 Essential (primary) hypertension: Secondary | ICD-10-CM

## 2021-10-27 DIAGNOSIS — E782 Mixed hyperlipidemia: Secondary | ICD-10-CM | POA: Diagnosis not present

## 2021-10-27 DIAGNOSIS — Z9889 Other specified postprocedural states: Secondary | ICD-10-CM

## 2021-10-27 DIAGNOSIS — R42 Dizziness and giddiness: Secondary | ICD-10-CM | POA: Diagnosis not present

## 2021-10-27 NOTE — Patient Instructions (Addendum)
Medication Instructions:  No changes  Call if you would like zetia 10 mg once a day for cholesterol (Drops numbers 20%)  If you need a refill on your cardiac medications before your next appointment, please call your pharmacy.   Lab work: No new labs needed  Testing/Procedures: Aorta ultrasound Lower extremity ultrasound  Follow-Up: At [facility], you and your health needs are our priority.  As part of our continuing mission to provide you with exceptional heart care, we have created designated Provider Care Teams.  These Care Teams include your primary Cardiologist (physician) and Advanced Practice Providers (APPs -  Physician Assistants and Nurse Practitioners) who all work together to provide you with the care you need, when you need it.  You will need a follow up appointment in 12 months  Providers on your designated Care Team:   Murray Hodgkins, NP Christell Faith, PA-C Cadence Kathlen Mody, [place]  COVID-19 Vaccine Information can be found at: [email or web address] For questions related to vaccine distribution or appointments, please email [email or web address] or call [phone].

## 2021-10-27 NOTE — Telephone Encounter (Signed)
Patient of Dr. Rockey Situ called to schedule vascular appt with Dr. Fletcher Anon for issues with legs. She notes feet are cold hx foot surgeries and having leg cramping   Scheduled with Dr. Fletcher Anon as new patient .

## 2021-10-27 NOTE — Progress Notes (Signed)
Cardiology Office Note  Date:  10/27/2021   ID:  Breanna Hodge, Breanna Hodge 1941/01/27, MRN [id number]  PCP:  Gladstone Lighter, MD   Chief Complaint  Patient presents with   Cold Feet    Patient had surgery on her feet, Jan. 11, 2023, she states, her feet have been extremely cold with cramping in legs since the surgery. Medications reviewed by the patient verbally.     HPI:  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 Who presents for follow-up of her lower extremity edema, hypertension, hyperlipidemia  Prior clinic visit 12/2020  Normal echo 5/21  In follow-up today, she presents with family Recent surgery for hammertoes performed by podiatry  she reports having muscle crasmps above knees This has been a chronic issue but worse since the surgery, etiology unclear Has not been as active recently Reports that she is currently not on a statin  Denies any near-syncope or syncope, blood pressure stable EKG personally reviewed by myself on todays visit Normal sinus rhythm rate 83 bpm unable to exclude old anterior MI, old inferior MI  Other past medical history reviewed  07/2020 was walking her dog, near syncope, syncope Friend had to pick her up, Was told "low sugar" Was not eating much at the time  zio 10/21 Normal sinus rhythm avg HR of 71 bpm.    73 Supraventricular Tachycardia runs occurred, the run with the fastest interval lasting 7 beats with a max rate of 214 bpm, the longest lasting 13.2 secs with an avg rate of 124 bpm.  No patient triggers   Isolated SVEs were occasional (2.0%, 26378), SVE Couplets were rare (<1.0%, 454), and SVE Triplets were rare (<1.0%, 84).    Isolated VEs were occasional (1.7%, 22405), VE Couplets  were rare (<1.0%, 27), and VE Triplets were rare (<1.0%, 1). Ventricular Bigeminy and Trigeminy were present.   Rare patient triggered events associated with PACs, PVCs  Echocardiogram May 2021, normal ejection fraction, no valve disease  Stress test February 2021 reviewed perfusion defect concerning for artifact apical region CT attenuation correction images with coronary calcification, aortic atherosclerosis  LE arterial doppler 05/2018 No hemodynamically significant arterial disease in bilateral lower  Extremities.  Stress test 03/2018 IMPRESSION 1. ABNORMAL STUDY. 2. SMALL ZONE OF MILD APICAL ISCHEMIA. 3. THIS STUDY IS REPORTED AT [facility] WITHOUT PATIENT  SYMPTOMS    OF CHEST PAIN, AND WITH NEGATIVE ST SEGMENT RESPONSE.  GATED STUDY 1. LEFT VENTRICULAR GATED STUDY DEMONSTRATES NORMAL LEFT VENTRICULAR    FUNCTION. 2. ESTIMATED LEFT VENTRICULAR EJECTION FRACTION IS 48%. 3. LEFT VENTRICULAR WALL MOTION IS NORMAL.   PMH:   has a past medical history of Anemia, Arthritis, Clotting disorder ([place]), Complication of anesthesia (1976), DLE (discoid lupus erythematosus), DVT (deep venous thrombosis) ([place]), H/O: Bell's palsy, Heart murmur, Hyperlipidemia, Hypertension, Rash, and Wears dentures.  PSH:    Past Surgical History:  Procedure Laterality Date   ABDOMINAL HYSTERECTOMY  1985   with bladder tack using mesh   ANTERIOR AND POSTERIOR REPAIR Bilateral 03/24/2020   Procedure: ANTERIOR (CYSTOCELE) AND POSTEROR REPAIR (RECTOCELE);  Surgeon: Harlin Heys, MD;  Location: [facility];  Service: Gynecology;  Laterality: Bilateral;   CATARACT EXTRACTION W/PHACO Left 07/14/2015   Procedure: CATARACT EXTRACTION PHACO AND INTRAOCULAR LENS PLACEMENT ([place]);  Surgeon: Rodena Piety  Thomes Lolling, MD;  Location: [place];  Service: Ophthalmology;  Laterality: Left;  PER DR OFFICE PT WANTS EARLY AS POSSIBLE   CATARACT EXTRACTION W/PHACO Right 08/25/2015   Procedure: CATARACT  EXTRACTION PHACO AND INTRAOCULAR LENS PLACEMENT (I[facility]);  Surgeon: Ronnell Freshwater, MD;  Location: [facility];  Service: Ophthalmology;  Laterality: Right;  PER DR OFFICE PT WANTS AS EARLY AS POSSIBLE   CHOLECYSTECTOMY  1987   COLONOSCOPY WITH PROPOFOL N/A 12/12/2019   Procedure: COLONOSCOPY WITH PROPOFOL;  Surgeon: Lin Landsman, MD;  Location: [facility];  Service: Gastroenterology;  Laterality: N/A;   EYE SURGERY     gyn surgery  1989   hysterectomy   KNEE ARTHROSCOPY  2009   right   KNEE ARTHROSCOPY  2007   right knee   vein removal  2019   Right leg    Current Outpatient Medications  Medication Sig Dispense Refill   ascorbic acid (VITAMIN C) 500 MG tablet Take 500 mg by mouth daily.     atorvastatin (LIPITOR) 10 MG tablet Take by mouth.     BLACK ELDERBERRY PO Take 1,000 mg by mouth daily.      Calcium-Magnesium-Vitamin D (CALCIUM 1200+D3 PO) Take 1 tablet by mouth daily.     Cholecalciferol (VITAMIN D3) 50 MCG (2000 UT) TABS Take by mouth.     Coenzyme Q10 (COQ-10) 100 MG CAPS Take 100 mg by mouth daily.     GARLIC PO Take 2,778 mg by mouth daily.     Krill Oil 1000 MG CAPS Take 1,000 mg by mouth every evening.     Lactobacillus (DIGESTIVE HEALTH PROBIOTIC PO) Take 1 capsule by mouth daily.     lisinopril (ZESTRIL) 20 MG tablet Take 1 tablet (20 mg total) by mouth daily. 90 tablet 3   Magnesium 500 MG CAPS Take 500 mg by mouth daily.     Misc Natural Products (OSTEO BI-FLEX JOINT SHIELD PO) Take 1 tablet by mouth every evening.     Multiple Vitamin (MULTIVITAMIN WITH MINERALS) TABS tablet Take 1 tablet by mouth daily. Centrum Silver     Red Yeast Rice 600 MG CAPS Take 1,200 mg by mouth daily in the afternoon.     Zinc 50 MG CAPS Take 50 mg by mouth daily.     No current facility-administered medications for this visit.     Allergies:   Naprosyn [naproxen], Rosuvastatin calcium, and Niacin and related   Social History:  The patient  reports that  she quit smoking about 61 years ago. Her smoking use included cigarettes. She has a 24.00 pack-year smoking history. She has never used smokeless tobacco. She reports that she does not drink alcohol and does not use drugs.   Family History:   family history includes Cancer in her father; Coronary artery disease in her mother; Heart disease in her brother; Lupus in her daughter.    Review of Systems: Review of Systems  Constitutional: Negative.   HENT: Negative.    Respiratory: Negative.    Cardiovascular: Negative.   Gastrointestinal: Negative.   Musculoskeletal: Negative.   Neurological:  Positive for dizziness.  Psychiatric/Behavioral: Negative.    All other systems reviewed and are negative.  PHYSICAL EXAM: VS:  BP 140/70 (BP Location: Left Arm, Patient Position: Sitting, Cuff Size: Normal)    Pulse 83    Ht 5\' 7"  (1.702 m)    Wt 150 lb 2 oz (68.1 kg)    SpO2 98%    BMI 23.51 kg/m  ,  BMI Body mass index is 23.51 kg/m. Constitutional:  oriented to person, place, and time. No distress.  HENT:  Head: Grossly normal Eyes:  no discharge. No scleral icterus.  Neck: No JVD, no carotid bruits  Cardiovascular: Regular rate and rhythm, no murmurs appreciated Pulmonary/Chest: Clear to auscultation bilaterally, no wheezes or rails Abdominal: Soft.  no distension.  no tenderness.  Musculoskeletal: Normal range of motion Neurological:  normal muscle tone. Coordination normal. No atrophy Skin: Skin warm and dry Psychiatric: normal affect, pleasant  Recent Labs: No results found for requested labs within last 8760 hours.    Lipid Panel Lab Results  Component Value Date   CHOL 171 11/27/2019   HDL 62.70 11/27/2019   LDLCALC 90 11/27/2019   TRIG 93.0 11/27/2019    Wt Readings from Last 3 Encounters:  10/27/21 150 lb 2 oz (68.1 kg)  12/29/20 152 lb 2 oz (69 kg)  08/12/20 151 lb (68.5 kg)     ASSESSMENT AND PLAN:  Problem List Items Addressed This Visit       Cardiology  Problems   Essential hypertension - Primary   Hyperlipidemia     Other   Dizziness   Dizziness, weakness Weight stable, blood pressure stable No recent near-syncope or syncope Continue lisinopril 20 daily  Hyperlipidemia Off her statin, now with muscle spasms Recommend she consider Zetia, she will call us if she would like to add this  Lower extremity pain, cramping Not on a statin We have ordered aorta and lower extremity arterial studies Unable to exclude claudication symptoms.  Seems to be happening more at rest  Lower extremity edema Stable, compression hose recommended  Essential hypertension Blood pressure is well controlled on today's visit. No changes made to the medications.   Total encounter time more than 25 minutes  Greater than 50% was spent in counseling and coordination of care with the patient   Signed, Esmond Plants, M.D., Ph.D. [place], [place] [address]

## 2021-11-02 ENCOUNTER — Telehealth: Payer: Self-pay | Admitting: Cardiovascular Disease

## 2021-11-02 MED ORDER — EZETIMIBE 10 MG PO TABS: 10.0000 mg | ORAL_TABLET | Freq: Every day | ORAL | 3 refills | Status: DC

## 2021-11-02 NOTE — Telephone Encounter (Signed)
Pt c/o medication issue:  1. Name of Medication: atorvastatin  2. How are you currently taking this medication (dosage and times per day)? 10 mg po q d   3. Are you having a reaction (difficulty breathing--STAT)? Muscle pain  4. What is your medication issue? Previously advised by Rockey Situ she could change to something without these side effects.   Please send new med to [facility] [address].

## 2021-11-02 NOTE — Telephone Encounter (Signed)
Per OV notes 1/31 w/Dr. Rockey Situ Hyperlipidemia Off her statin, now with muscle spasms Recommend she consider Zetia, she will call us if she would like to add this  Zetia 10 mg daily sent in to [facility]  Lipitor added back to pt's allergy list for "intolerance".

## 2021-11-04 ENCOUNTER — Encounter: Payer: Self-pay | Admitting: Podiatry

## 2021-11-04 ENCOUNTER — Ambulatory Visit (INDEPENDENT_AMBULATORY_CARE_PROVIDER_SITE_OTHER): Payer: Medicare Other | Admitting: Podiatry

## 2021-11-04 ENCOUNTER — Other Ambulatory Visit: Payer: Self-pay

## 2021-11-04 DIAGNOSIS — D2371 Other benign neoplasm of skin of right lower limb, including hip: Secondary | ICD-10-CM | POA: Diagnosis not present

## 2021-11-04 DIAGNOSIS — M79674 Pain in right toe(s): Secondary | ICD-10-CM

## 2021-11-04 DIAGNOSIS — B351 Tinea unguium: Secondary | ICD-10-CM

## 2021-11-04 DIAGNOSIS — M205X9 Other deformities of toe(s) (acquired), unspecified foot: Secondary | ICD-10-CM

## 2021-11-04 DIAGNOSIS — Z9889 Other specified postprocedural states: Secondary | ICD-10-CM

## 2021-11-04 DIAGNOSIS — D2372 Other benign neoplasm of skin of left lower limb, including hip: Secondary | ICD-10-CM

## 2021-11-04 NOTE — Progress Notes (Signed)
She presents today for follow-up of her tenotomy's second third and fourth toes of the right foot third toes on the left foot states that second toe is sore on the right side.  There is no erythema cellulitis drainage or odor. The tip of the second toe right is very callused. The tenderness seems to be coming from more the forefoot than the toe itself.  Well-healing surgical toes. Toenails and calluses debrided 1-5 bilateral today.  Encouraged patient to try and get back into her regular shoes, as she is still wearing the surgical shoes. I feel that may improve her forefoot pain. Dispensed toe caps for the 2nd toe right and 3rd toe left. She will follow up with Dr. Milinda Pointer in 2-3 weeks to recheck toes and forefoot pain.

## 2021-11-13 ENCOUNTER — Other Ambulatory Visit: Payer: Self-pay | Admitting: Cardiovascular Disease

## 2021-11-13 DIAGNOSIS — Z9889 Other specified postprocedural states: Secondary | ICD-10-CM

## 2021-11-13 DIAGNOSIS — I739 Peripheral vascular disease, unspecified: Secondary | ICD-10-CM

## 2021-11-20 ENCOUNTER — Other Ambulatory Visit: Payer: Medicare Other

## 2021-11-25 ENCOUNTER — Ambulatory Visit (INDEPENDENT_AMBULATORY_CARE_PROVIDER_SITE_OTHER): Payer: Medicare Other | Admitting: Podiatry

## 2021-11-25 ENCOUNTER — Ambulatory Visit (INDEPENDENT_AMBULATORY_CARE_PROVIDER_SITE_OTHER): Payer: 59

## 2021-11-25 ENCOUNTER — Other Ambulatory Visit: Payer: Self-pay

## 2021-11-25 ENCOUNTER — Ambulatory Visit: Payer: Medicare Other | Admitting: Podiatry

## 2021-11-25 DIAGNOSIS — M79605 Pain in left leg: Secondary | ICD-10-CM | POA: Diagnosis not present

## 2021-11-25 DIAGNOSIS — I739 Peripheral vascular disease, unspecified: Secondary | ICD-10-CM

## 2021-11-25 DIAGNOSIS — Z9889 Other specified postprocedural states: Secondary | ICD-10-CM | POA: Diagnosis not present

## 2021-11-25 DIAGNOSIS — R200 Anesthesia of skin: Secondary | ICD-10-CM | POA: Diagnosis not present

## 2021-11-25 NOTE — Progress Notes (Signed)
She presents today for follow-up of her tenotomy and forefoot pain right the toes and the bottom of the foot is better I have vascular studies this morning and they were good.  She complains of gripping electrical type pain at the front of her left leg states that it really does not start in the foot but up into the leg and then after this continues on for a while she has pain and soreness in her left thigh.  She states that it never radiates to or from the back but she does have a history of back pain and problems. ? ?Objective: Vital signs are stable alert oriented x3 pulses are barely palpable but the vascular studies are generally good she does have an abnormal toe brachial indices left foot.  She has distal benign lesions to the toes. ? ?Assessment: Benign skin lesions.  Cannot rule out a radiculopathy left foot leg thigh pain. ? ?Plan: Refer her to neurology for evaluation and management of her leg pain.  Debrided all benign skin lesions. ?

## 2021-11-30 ENCOUNTER — Telehealth: Payer: Self-pay

## 2021-11-30 NOTE — Telephone Encounter (Signed)
Able to reach pt's daughter Breanna Hodge (DPR approved) regarding Breanna Hodge's recent LE ultrasound and AAA ultrasound,  Dr. Rockey Situ had a chance to review her results and advised  ? ?"No significant aortic aneurysm on ultrasound  ? 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 ?Breanna Hodge very thankful for the phone call of her mother's results, all questions and concerns were address with nothing further at this time. Will see at next schedule NP appt with Dr. Fletcher Anon tomorrow, will discuss with him further of results and needed for PCSK9 inhibitor.  ? ? ?

## 2021-12-01 ENCOUNTER — Encounter: Payer: Self-pay | Admitting: Cardiovascular Disease

## 2021-12-01 ENCOUNTER — Ambulatory Visit (INDEPENDENT_AMBULATORY_CARE_PROVIDER_SITE_OTHER): Payer: Medicare Other | Admitting: Cardiovascular Disease

## 2021-12-01 ENCOUNTER — Other Ambulatory Visit: Payer: Self-pay

## 2021-12-01 VITALS — BP 152/80 | HR 88 | Ht 66.0 in | Wt 149.0 lb

## 2021-12-01 DIAGNOSIS — E782 Mixed hyperlipidemia: Secondary | ICD-10-CM

## 2021-12-01 DIAGNOSIS — M79604 Pain in right leg: Secondary | ICD-10-CM | POA: Diagnosis not present

## 2021-12-01 DIAGNOSIS — I10 Essential (primary) hypertension: Secondary | ICD-10-CM

## 2021-12-01 DIAGNOSIS — M79605 Pain in left leg: Secondary | ICD-10-CM | POA: Diagnosis not present

## 2021-12-01 NOTE — Patient Instructions (Signed)
Medication Instructions:  ?Your physician recommends that you continue on your current medications as directed. Please refer to the Current Medication list given to you today. ? ?*If you need a refill on your cardiac medications before your next appointment, please call your pharmacy* ? ? ?Lab Work: ?None ordered ?If you have labs (blood work) drawn today and your tests are completely normal, you will receive your results only by: ?MyChart Message (if you have MyChart) OR ?A paper copy in the mail ?If you have any lab test that is abnormal or we need to change your treatment, we will call you to review the results. ? ? ?Testing/Procedures: ?None ordered ? ? ?Follow-Up: ?At [facility], you and your health needs are our priority.  As part of our continuing mission to provide you with exceptional heart care, we have created designated Provider Care Teams.  These Care Teams include your primary Cardiologist (physician) and Advanced Practice Providers (APPs -  Physician Assistants and Nurse Practitioners) who all work together to provide you with the care you need, when you need it. ? ?We recommend signing up for the patient portal called "MyChart".  Sign up information is provided on this After Visit Summary.  MyChart is used to connect with patients for Virtual Visits (Telemedicine).  Patients are able to view lab/test results, encounter notes, upcoming appointments, etc.  Non-urgent messages can be sent to your provider as well.   ?To learn more about what you can do with MyChart, go to [email or web address].   ? ?Your next appointment:   ?As needed ? ?The format for your next appointment:   ?In Person ? ?Provider:   ?Kathlyn [place], MD{ ? ? ? ?Other Instructions ?N/A ? ?

## 2021-12-01 NOTE — Progress Notes (Signed)
?  ?Cardiology Office Note ? ? ?Date:  12/01/2021  ? ?ID:  [name], DOB Dec 29, 1940, MRN [id number] ? ?PCP:  Gladstone Lighter, MD  ?Cardiologist:  Dr. Rockey Situ ? ?Chief Complaint  ?Patient presents with  ? New Patient (Initial Visit)  ?  self referral feet are cold hx foot surgeries and cramping. Meds reviewed verbally with patient.  ?  ? ? ?  ?History of Present Illness: ?Breanna Hodge is a 81 y.o. female who was referred for evaluation of possible peripheral arterial disease ?She has known history of hyperlipidemia, previous tobacco use, left bundle branch block and chronic venous insufficiency. ?She had recent hammertoe surgery and healed very well.  She complains of her feet being cold with some atypical cramping in her legs. ?She underwent noninvasive vascular studies earlier this month which showed normal ABI bilaterally but slightly abnormal waveforms mildly abnormal left toe pressure.  Aortoiliac duplex showed no evidence of aortic aneurysm .  There was minimal disease in the iliac arteries. ?She denies chest pain or shortness of breath.  She is able to walk 3 miles every day with her dog with no significant limitations. ? ?Past Medical History:  ?Diagnosis Date  ? Anemia   ? Arthritis   ? Clotting disorder ([place])   ? DVT, RLE during pregnancy  ? Complication of anesthesia 1976  ? woke up during the procedure  ? DLE (discoid lupus erythematosus)   ? diagnosed at [facility] by skin biopsy  ? DVT (deep venous thrombosis) ([place])   ? RLE, associated with pregnancy  ? H/O: Bell's palsy   ? approx 15 yrs ago, resolved  ? Heart murmur   ? Hyperlipidemia   ? Hypertension   ? Rash   ? recurring on RLE, reported as discoid lupus by patient per prior [facility] eval  ? Wears dentures   ? Full upper and lower. Does NOT wear lower.  ? ? ?Past Surgical History:  ?Procedure Laterality Date  ? ABDOMINAL HYSTERECTOMY  1985  ? with bladder tack using mesh  ? ANTERIOR AND POSTERIOR REPAIR Bilateral 03/24/2020  ? Procedure:  ANTERIOR (CYSTOCELE) AND POSTEROR REPAIR (RECTOCELE);  Surgeon: Harlin Heys, MD;  Location: [facility] ORS;  Service: Gynecology;  Laterality: Bilateral;  ? CATARACT EXTRACTION W/PHACO Left 07/14/2015  ? Procedure: CATARACT EXTRACTION PHACO AND INTRAOCULAR LENS PLACEMENT (IOC);  Surgeon: Ronnell Freshwater, MD;  Location: [place];  Service: Ophthalmology;  Laterality: Left;  PER DR OFFICE PT WANTS EARLY AS POSSIBLE  ? CATARACT EXTRACTION W/PHACO Right 08/25/2015  ? Procedure: CATARACT EXTRACTION PHACO AND INTRAOCULAR LENS PLACEMENT (IOC);  Surgeon: Ronnell Freshwater, MD;  Location: [place];  Service: Ophthalmology;  Laterality: Right;  PER DR OFFICE PT WANTS AS EARLY AS POSSIBLE  ? CHOLECYSTECTOMY  1987  ? COLONOSCOPY WITH PROPOFOL N/A 12/12/2019  ? Procedure: COLONOSCOPY WITH PROPOFOL;  Surgeon: Lin Landsman, MD;  Location: [facility] ENDOSCOPY;  Service: Gastroenterology;  Laterality: N/A;  ? EYE SURGERY    ? gyn surgery  1989  ? hysterectomy  ? KNEE ARTHROSCOPY  2009  ? right  ? KNEE ARTHROSCOPY  2007  ? right knee  ? vein removal  2019  ? Right leg  ? ? ? ?Current Outpatient Medications  ?Medication Sig Dispense Refill  ? ascorbic acid (VITAMIN C) 500 MG tablet Take 500 mg by mouth daily.    ? BLACK ELDERBERRY PO Take 1,000 mg by mouth daily.     ? Calcium-Magnesium-Vitamin D (CALCIUM  1200+D3 PO) Take 1 tablet by mouth daily.    ? Cholecalciferol (VITAMIN D3) 50 MCG (2000 UT) TABS Take by mouth.    ? Coenzyme Q10 (COQ-10) 100 MG CAPS Take 100 mg by mouth daily.    ? ezetimibe (ZETIA) 10 MG tablet Take 1 tablet (10 mg total) by mouth daily. 90 tablet 3  ? GARLIC PO Take 7,829 mg by mouth daily.    ? Krill Oil 1000 MG CAPS Take 1,000 mg by mouth every evening.    ? Lactobacillus (DIGESTIVE HEALTH PROBIOTIC PO) Take 1 capsule by mouth daily.    ? lisinopril (ZESTRIL) 20 MG tablet Take 1 tablet (20 mg total) by mouth daily. 90 tablet 3  ? Magnesium 500 MG CAPS Take 500 mg by  mouth daily.    ? Misc Natural Products (OSTEO BI-FLEX JOINT SHIELD PO) Take 1 tablet by mouth every evening.    ? Multiple Vitamin (MULTIVITAMIN WITH MINERALS) TABS tablet Take 1 tablet by mouth daily. Centrum Silver    ? Zinc 50 MG CAPS Take 50 mg by mouth daily.    ? ?No current facility-administered medications for this visit.  ? ? ?Allergies:   Naprosyn [naproxen], Rosuvastatin calcium, Lipitor [atorvastatin], and Niacin and related  ? ? ?Social History:  The patient  reports that she quit smoking about 61 years ago. Her smoking use included cigarettes. She has a 24.00 pack-year smoking history. She has never used smokeless tobacco. She reports that she does not drink alcohol and does not use drugs.  ? ?Family History:  The patient's family history includes Cancer in her father; Coronary artery disease in her mother; Heart disease in her brother; Lupus in her daughter.  ? ? ?ROS:  Please see the history of present illness.   Otherwise, review of systems are positive for none.   All other systems are reviewed and negative.  ? ? ?PHYSICAL EXAM: ?VS:  BP (!) 152/80 (BP Location: Left Arm, Patient Position: Sitting, Cuff Size: Normal)   Pulse 88   Ht '5\' 6"'$  (1.676 m)   Wt 149 lb (67.6 kg)   SpO2 98%   BMI 24.05 kg/m?  , BMI Body mass index is 24.05 kg/m?. ?GEN: Well nourished, well developed, in no acute distress  ?HEENT: normal  ?Neck: no JVD, carotid bruits, or masses ?Cardiac: RRR; no  rubs, or gallops,no edema .  2 out of 6 systolic murmur in the aortic area which is early peaking ?Respiratory:  clear to auscultation bilaterally, normal work of breathing ?GI: soft, nontender, nondistended, + BS ?MS: no deformity or atrophy  ?Skin: warm and dry, no rash ?Neuro:  Strength and sensation are intact ?Psych: euthymic mood, full affect ?Vascular: Femoral pulses are normal bilaterally.  Distal pulses are diminished but palpable. ? ? ?EKG:  EKG is not ordered today. ? ? ? ?Recent Labs: ?No results found for  requested labs within last 8760 hours.  ? ? ?Lipid Panel ?   ?Component Value Date/Time  ? CHOL 171 11/27/2019 1127  ? TRIG 93.0 11/27/2019 1127  ? HDL 62.70 11/27/2019 1127  ? CHOLHDL 3 11/27/2019 1127  ? VLDL 18.6 11/27/2019 1127  ? [place] 90 11/27/2019 1127  ? LDLDIRECT 189.9 05/09/2012 1105  ? ?  ? ?Wt Readings from Last 3 Encounters:  ?12/01/21 149 lb (67.6 kg)  ?10/27/21 150 lb 2 oz (68.1 kg)  ?12/29/20 152 lb 2 oz (69 kg)  ?  ? ? ? ?PAD Screen 12/01/2021 11/06/2019  ?Previous PAD  dx? Yes No  ?Previous surgical procedure? No No  ?Pain with walking? No No  ?Feet/toe relief with dangling? No No  ?Painful, non-healing ulcers? No No  ?Extremities discolored? No No  ? ? ? ? ?ASSESSMENT AND PLAN: ? ?1.  Atypical leg pain: I do not think her symptoms are consistent with claudication.  Her vascular testing was overall reassuring with near normal ABI.  Toe pressure was mildly abnormal on the left side but her femoral artery pulses are normal.  Overall, she has evidence of atherosclerosis with no obstructive disease.  Recommend treatment of risk factors. ?Overall, she is able to walk 3 miles daily and does not seem to be limited. ? ?2.  Hyperlipidemia: She has tolerance to statins.  Most recent lipid profile in July showed an LDL of 120. ? ?3.  Essential hypertension: Blood pressure is elevated today.  Consider increasing lisinopril. ? ? ? ?Disposition:   Continue regular follow-up with Dr. Rockey Situ and follow-up with me as needed if there is any worsening ? ?Signed, ? ?Kathlyn Sacramento, MD  ?12/01/2021 2:05 PM    ?[place] ?

## 2021-12-17 ENCOUNTER — Ambulatory Visit: Payer: Medicare Other | Admitting: Podiatry

## 2022-02-17 ENCOUNTER — Ambulatory Visit (INDEPENDENT_AMBULATORY_CARE_PROVIDER_SITE_OTHER): Payer: Medicare Other | Admitting: Podiatry

## 2022-02-17 ENCOUNTER — Encounter: Payer: Self-pay | Admitting: Podiatry

## 2022-02-17 DIAGNOSIS — D2372 Other benign neoplasm of skin of left lower limb, including hip: Secondary | ICD-10-CM | POA: Diagnosis not present

## 2022-02-17 DIAGNOSIS — M79674 Pain in right toe(s): Secondary | ICD-10-CM | POA: Diagnosis not present

## 2022-02-17 DIAGNOSIS — D2371 Other benign neoplasm of skin of right lower limb, including hip: Secondary | ICD-10-CM | POA: Diagnosis not present

## 2022-02-17 DIAGNOSIS — B351 Tinea unguium: Secondary | ICD-10-CM

## 2022-02-17 NOTE — Progress Notes (Signed)
She presents today chief complaint of painful elongated toenails and calluses.  Objective: Pulses are palpable.  Toenails are long thick yellow dystrophic with mycotic small multiple reactive hyperkeratotic lesions plantar aspect of the bilateral foot multiple areas of fat atrophy particularly underneath the fifth metatarsal and the lesser metatarsals bilateral.  Assessment: Pain limb secondary to onychomycosis benign skin lesions and fat atrophy with metatarsalgia.  Plan: Debridement of nails and calluses bilateral.  Follow-up with [place] in 2 to 3 months

## 2022-03-01 ENCOUNTER — Ambulatory Visit: Payer: Medicare Other | Admitting: Podiatry

## 2022-05-24 ENCOUNTER — Ambulatory Visit: Payer: Medicare Other | Admitting: Podiatry

## 2022-07-16 ENCOUNTER — Encounter: Payer: Self-pay | Admitting: Emergency Medicine

## 2022-07-16 ENCOUNTER — Emergency Department
Admission: EM | Admit: 2022-07-16 | Discharge: 2022-07-16 | Disposition: A | Discharge status: Home / Self Care | Payer: Medicare Other | Attending: Emergency Medicine | Admitting: Emergency Medicine

## 2022-07-16 ENCOUNTER — Emergency Department: Payer: Medicare Other

## 2022-07-16 ENCOUNTER — Encounter (HOSPITAL_COMMUNITY): Payer: Self-pay

## 2022-07-16 ENCOUNTER — Other Ambulatory Visit: Payer: Self-pay

## 2022-07-16 DIAGNOSIS — I10 Essential (primary) hypertension: Secondary | ICD-10-CM | POA: Insufficient documentation

## 2022-07-16 DIAGNOSIS — Y92410 Unspecified street and highway as the place of occurrence of the external cause: Secondary | ICD-10-CM | POA: Diagnosis not present

## 2022-07-16 DIAGNOSIS — S60811A Abrasion of right wrist, initial encounter: Secondary | ICD-10-CM | POA: Diagnosis not present

## 2022-07-16 DIAGNOSIS — R739 Hyperglycemia, unspecified: Secondary | ICD-10-CM | POA: Diagnosis not present

## 2022-07-16 DIAGNOSIS — R1011 Right upper quadrant pain: Secondary | ICD-10-CM | POA: Insufficient documentation

## 2022-07-16 DIAGNOSIS — S299XXA Unspecified injury of thorax, initial encounter: Secondary | ICD-10-CM | POA: Diagnosis present

## 2022-07-16 DIAGNOSIS — S50812A Abrasion of left forearm, initial encounter: Secondary | ICD-10-CM | POA: Insufficient documentation

## 2022-07-16 DIAGNOSIS — S2241XA Multiple fractures of ribs, right side, initial encounter for closed fracture: Secondary | ICD-10-CM

## 2022-07-16 LAB — COMPREHENSIVE METABOLIC PANEL
ALT: 23 U/L (ref 0–44)
AST: 43 U/L — ABNORMAL HIGH (ref 15–41)
Albumin: 4.3 g/dL (ref 3.5–5.0)
Alkaline Phosphatase: 55 U/L (ref 38–126)
Anion gap: 11 (ref 5–15)
BUN: 20 mg/dL (ref 8–23)
CO2: 25 mmol/L (ref 22–32)
Calcium: 10 mg/dL (ref 8.9–10.3)
Chloride: 104 mmol/L (ref 98–111)
Creatinine, Ser: 0.62 mg/dL (ref 0.44–1.00)
GFR, Estimated: >60 mL/min (ref >60)
Glucose, Bld: 112 mg/dL — ABNORMAL HIGH (ref 70–99)
Potassium: 4.1 mmol/L (ref 3.5–5.1)
Sodium: 140 mmol/L (ref 135–145)
Total Bilirubin: 1 mg/dL (ref 0.3–1.2)
Total Protein: 7.7 g/dL (ref 6.5–8.1)

## 2022-07-16 LAB — CBG MONITORING, ED: Glucose-Capillary: 124 mg/dL — ABNORMAL HIGH (ref 70–99)

## 2022-07-16 LAB — CBC WITH DIFFERENTIAL/PLATELET
Abs Immature Granulocytes: 0.14 10*3/uL — ABNORMAL HIGH (ref 0.00–0.07)
Basophils Absolute: 0.1 10*3/uL (ref 0.0–0.1)
Basophils Relative: 1 %
Eosinophils Absolute: 0.1 10*3/uL (ref 0.0–0.5)
Eosinophils Relative: 1 %
HCT: 47.5 % — ABNORMAL HIGH (ref 36.0–46.0)
Hemoglobin: 15.5 g/dL — ABNORMAL HIGH (ref 12.0–15.0)
Immature Granulocytes: 1 %
Lymphocytes Relative: 12 %
Lymphs Abs: 1.6 10*3/uL (ref 0.7–4.0)
MCH: 30.6 pg (ref 26.0–34.0)
MCHC: 32.6 g/dL (ref 30.0–36.0)
MCV: 93.7 fL (ref 80.0–100.0)
Monocytes Absolute: 0.8 10*3/uL (ref 0.1–1.0)
Monocytes Relative: 6 %
Neutro Abs: 11.1 10*3/uL — ABNORMAL HIGH (ref 1.7–7.7)
Neutrophils Relative %: 79 %
Platelets: 217 10*3/uL (ref 150–400)
RBC: 5.07 MIL/uL (ref 3.87–5.11)
RDW: 13.4 % (ref 11.5–15.5)
WBC: 13.9 10*3/uL — ABNORMAL HIGH (ref 4.0–10.5)
nRBC: 0 % (ref 0.0–0.2)

## 2022-07-16 MED ORDER — IOHEXOL 300 MG/ML  SOLN
100.0000 mL | Freq: Once | INTRAMUSCULAR | Status: AC | PRN
  Administered 2022-07-16: 100 mL via INTRAVENOUS

## 2022-07-16 MED ORDER — MORPHINE SULFATE (PF) 2 MG/ML IV SOLN
2.0000 mg | Freq: Once | INTRAVENOUS | Status: AC
  Administered 2022-07-16: 2 mg via INTRAVENOUS
  Filled 2022-07-16: qty 1

## 2022-07-16 MED ORDER — LIDOCAINE 5 % EX PTCH: 1.0000 | MEDICATED_PATCH | CUTANEOUS | 0 refills | Status: DC

## 2022-07-16 MED ORDER — OXYCODONE HCL 5 MG PO TABS: 5.0000 mg | ORAL_TABLET | Freq: Three times a day (TID) | ORAL | 0 refills | Status: AC | PRN
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
## 2022-07-16 NOTE — ED Notes (Signed)
The pt advised after standing and pivoting a short distance to the wheel chair she became dizzy. The pt advised she normally does have dizzy spells. The pt was offered two cups of juice and her blood sugar was checked. The pt advised she felt better and wanted to be discharged. Dr. Starleen Blue was made aware of the pt's disposition. Pt was discharged.
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
## 2022-07-27 DIAGNOSIS — R7303 Prediabetes: Secondary | ICD-10-CM | POA: Insufficient documentation

## 2022-10-18 ENCOUNTER — Other Ambulatory Visit: Payer: Self-pay | Admitting: Cardiovascular Disease

## 2022-10-18 NOTE — Telephone Encounter (Signed)
Please schedule 12 mo F/U appointment for 90 day refills. Thank you!

## 2022-10-18 NOTE — Telephone Encounter (Signed)
Scheduled on 3/19

## 2022-10-22 ENCOUNTER — Other Ambulatory Visit: Payer: Self-pay | Admitting: Internal Medicine

## 2022-10-22 DIAGNOSIS — R413 Other amnesia: Secondary | ICD-10-CM

## 2022-11-08 ENCOUNTER — Ambulatory Visit
Admission: RE | Admit: 2022-11-08 | Discharge: 2022-11-08 | Disposition: A | Discharge status: Home / Self Care | Payer: Medicare Other | Source: Ambulatory Visit | Attending: Internal Medicine | Admitting: Internal Medicine

## 2022-11-08 DIAGNOSIS — R413 Other amnesia: Secondary | ICD-10-CM | POA: Diagnosis present

## 2022-12-13 NOTE — Progress Notes (Unsigned)
Cardiology Office Note  Date:  12/14/2022   ID:  Breanna Hodge, DOB March 03, 1941, MRN [id number]  PCP:  Gladstone Lighter, MD   Chief Complaint  Patient presents with   12 month follow up     "Doing well." Medications reviewed by the patient verbally.     HPI:  [name] is a 82--year-old woman with past medical history of hyperlipidemia 4 yr of smoking, remote Left bundle branch block Vascular issues/varicose veins status post RLE GSV RFA on 07/18/2018. 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 CT  images with coronary calcification, aortic atherosclerosis Aortoiliac disease documented March 2023 Who presents for follow-up of her lower extremity edema, hypertension, hyperlipidemia  Prior clinic visit January 2023 Seen by one of our providers March 2023  Normal echo 5/21 In follow-up today, she presents with family  Active at baseline, likes to go shopping but no regular exercise program Walks her dog No near-syncope or syncope Denies significant claudication type symptoms on ambulation No chest pain or shortness of breath on exertion  Reports that she is taking her Zetia Prior statin intolerance to Crestor  Lab work reviewed A1C 6.0 Cholesterol running in the 200 range  EKG personally reviewed by myself on todays visit Normal sinus rhythm rate 66 bpm unable to exclude old anterior MI, old inferior MI  Other past medical history reviewed  07/2020 was walking her dog, near syncope, syncope Friend had to pick her up, Was told "low sugar" Was not eating much at the time  zio 10/21 Normal sinus rhythm avg HR of 71 bpm.    73 Supraventricular Tachycardia runs occurred, the run with the fastest interval lasting 7 beats with a max rate of 214 bpm, the longest lasting 13.2 secs with an avg rate of 124 bpm.  No patient triggers   Isolated SVEs were occasional (2.0%, [id number]), SVE Couplets were rare (<1.0%, 454), and  SVE Triplets were rare (<1.0%, 84).    Isolated VEs were occasional (1.7%, [id number]), VE Couplets were rare (<1.0%, 27), and VE Triplets were rare (<1.0%, 1). Ventricular Bigeminy and Trigeminy were present.   Rare patient triggered events associated with PACs, PVCs  Stress test February 2021 reviewed perfusion defect concerning for artifact apical region CT attenuation correction images with coronary calcification, aortic atherosclerosis  LE arterial doppler 05/2018 No hemodynamically significant arterial disease in bilateral lower  Extremities.  Stress test 03/2018 IMPRESSION 1. ABNORMAL STUDY. 2. SMALL ZONE OF MILD APICAL ISCHEMIA. 3. THIS STUDY IS REPORTED AT [facility] WITHOUT PATIENT  SYMPTOMS    OF CHEST PAIN, AND WITH NEGATIVE ST SEGMENT RESPONSE.  GATED STUDY 1. LEFT VENTRICULAR GATED STUDY DEMONSTRATES NORMAL LEFT VENTRICULAR    FUNCTION. 2. ESTIMATED LEFT VENTRICULAR EJECTION FRACTION IS 48%. 3. LEFT VENTRICULAR WALL MOTION IS NORMAL.   PMH:   has a past medical history of Anemia, Arthritis, Clotting disorder ([place]), Complication of anesthesia (1976), DLE (discoid lupus erythematosus), DVT (deep venous thrombosis) ([place]), H/O: Bell's palsy, Heart murmur, Hyperlipidemia, Hypertension, Rash, and Wears dentures.  PSH:    Past Surgical History:  Procedure Laterality Date   ABDOMINAL HYSTERECTOMY  1985   with bladder tack using mesh   ANTERIOR AND POSTERIOR REPAIR Bilateral 03/24/2020   Procedure: ANTERIOR (CYSTOCELE) AND POSTEROR REPAIR (RECTOCELE);  Surgeon: Harlin Heys, MD;  Location: [facility];  Service: Gynecology;  Laterality: Bilateral;   CATARACT EXTRACTION W/PHACO Left 07/14/2015   Procedure: CATARACT EXTRACTION PHACO AND INTRAOCULAR LENS PLACEMENT ([place]);  Surgeon: Ronnell Freshwater, MD;  Location: [facility];  Service: Ophthalmology;  Laterality: Left;  PER DR OFFICE PT WANTS EARLY AS POSSIBLE   CATARACT EXTRACTION W/PHACO Right  08/25/2015   Procedure: CATARACT EXTRACTION PHACO AND INTRAOCULAR LENS PLACEMENT ([facility]);  Surgeon: Ronnell Freshwater, MD;  Location: [facility];  Service: Ophthalmology;  Laterality: Right;  PER DR OFFICE PT WANTS AS EARLY AS POSSIBLE   CHOLECYSTECTOMY  1987   COLONOSCOPY WITH PROPOFOL N/A 12/12/2019   Procedure: COLONOSCOPY WITH PROPOFOL;  Surgeon: Lin Landsman, MD;  Location: [facility];  Service: Gastroenterology;  Laterality: N/A;   EYE SURGERY     gyn surgery  1989   hysterectomy   KNEE ARTHROSCOPY  2009   right   KNEE ARTHROSCOPY  2007   right knee   vein removal  2019   Right leg    Current Outpatient Medications  Medication Sig Dispense Refill   ascorbic acid (VITAMIN C) 500 MG tablet Take 500 mg by mouth daily.     BLACK ELDERBERRY PO Take 1,000 mg by mouth daily.      Calcium-Magnesium-Vitamin D (CALCIUM 1200+D3 PO) Take 1 tablet by mouth daily.     Cholecalciferol (VITAMIN D3) 50 MCG (2000 UT) TABS Take by mouth.     Coenzyme Q10 (COQ-10) 100 MG CAPS Take 100 mg by mouth daily.     ezetimibe (ZETIA) 10 MG tablet TAKE 1 TABLET BY MOUTH EVERY DAY 90 tablet 0   Krill Oil 1000 MG CAPS Take 1,000 mg by mouth every evening.     Lactobacillus (DIGESTIVE HEALTH PROBIOTIC PO) Take 1 capsule by mouth daily.     lisinopril (ZESTRIL) 20 MG tablet Take 1 tablet (20 mg total) by mouth daily. 90 tablet 3   Magnesium 500 MG CAPS Take 500 mg by mouth daily.     Misc Natural Products (OSTEO BI-FLEX JOINT SHIELD PO) Take 1 tablet by mouth every evening.     Multiple Vitamin (MULTIVITAMIN WITH MINERALS) TABS tablet Take 1 tablet by mouth daily. Centrum Silver     Zinc 50 MG CAPS Take 50 mg by mouth daily.     lidocaine (LIDODERM) 5 % Place 1 patch onto the skin daily. Remove & Discard patch within 12 hours or as directed by MD (Patient not taking: Reported on 12/14/2022) 30 patch 0   No current facility-administered medications for this visit.     Allergies:    Naprosyn [naproxen], Rosuvastatin calcium, Lipitor [atorvastatin], and Niacin and related   Social History:  The patient  reports that she quit smoking about 62 years ago. Her smoking use included cigarettes. She has a 24.00 pack-year smoking history. She has never used smokeless tobacco. She reports that she does not drink alcohol and does not use drugs.   Family History:   family history includes Cancer in her father; Coronary artery disease in her mother; Heart disease in her brother; Lupus in her daughter.    Review of Systems: Review of Systems  Constitutional: Negative.   HENT: Negative.    Respiratory: Negative.    Cardiovascular: Negative.   Gastrointestinal: Negative.   Musculoskeletal: Negative.   Neurological: Negative.   Psychiatric/Behavioral: Negative.    All other systems reviewed and are negative.   PHYSICAL EXAM: VS:  BP (!) 140/80 (BP Location: Left Arm, Patient Position: Sitting, Cuff Size: Normal)   Pulse 66   Ht 5\' 8"  (1.727 m)   Wt 146 lb 4 oz (66.3 kg)  SpO2 98%   BMI 22.24 kg/m  , BMI Body mass index is 22.24 kg/m. Constitutional:  oriented to person, place, and time. No distress.  HENT:  Head: Grossly normal Eyes:  no discharge. No scleral icterus.  Neck: No JVD, no carotid bruits  Cardiovascular: Regular rate and rhythm, no murmurs appreciated Pulmonary/Chest: Clear to auscultation bilaterally, no wheezes or rails Abdominal: Soft.  no distension.  no tenderness.  Musculoskeletal: Normal range of motion Neurological:  normal muscle tone. Coordination normal. No atrophy Skin: Skin warm and dry Psychiatric: normal affect, pleasant   Recent Labs: 07/16/2022: ALT 23; BUN 20; Creatinine, Ser 0.62; Hemoglobin 15.5; Platelets 217; Potassium 4.1; Sodium 140    Lipid Panel Lab Results  Component Value Date   CHOL 171 11/27/2019   HDL 62.70 11/27/2019   LDLCALC 90 11/27/2019   TRIG 93.0 11/27/2019    Wt Readings from Last 3 Encounters:   12/14/22 146 lb 4 oz (66.3 kg)  07/16/22 150 lb (68 kg)  12/01/21 149 lb (67.6 kg)     ASSESSMENT AND PLAN:  Problem List Items Addressed This Visit       Cardiology Problems   Essential hypertension   Relevant Orders   EKG 12-Lead   Hyperlipidemia   Relevant Orders   EKG 12-Lead     Other   Dizziness   Other Visit Diagnoses     PAD (peripheral artery disease) ([place])    -  Primary   Relevant Orders   EKG 12-Lead   Lower extremity edema       Bilateral leg pain         Dizziness, weakness Blood pressure is well controlled on today's visit. No changes made to the medications. Continue lisinopril 20 daily  Hyperlipidemia Off Crestor secondary to muscle spasms Reports she is taking Zetia, no significant change in total cholesterol numbers Still running 200 Given coronary calcification, aortic atherosclerosis, significant lower extremity PAD, recommended more aggressive cholesterol management Recommended PCSK9 inhibitor, she prefers to retry a statin Recommend she try a atorvastatin 10 mg every other day with slow titration up to daily  Lower extremity pain, cramping Reports symptoms are stable, significant lower extremity arterial disease on ultrasound early 2023 Denies claudication symptoms  Lower extremity edema Denies significant leg edema  Essential hypertension Blood pressure is well controlled on today's visit. No changes made to the medications.  PAD Aortoiliac disease documented March 2023 Denies claudication symptoms Recommended more aggressive management of cholesterol   Total encounter time more than 30 minutes  Greater than 50% was spent in counseling and coordination of care with the patient   Signed, Esmond Plants, M.D., Ph.D. [place], [place]

## 2022-12-14 ENCOUNTER — Ambulatory Visit: Payer: Medicare Other | Attending: Cardiovascular Disease | Admitting: Cardiovascular Disease

## 2022-12-14 ENCOUNTER — Encounter: Payer: Self-pay | Admitting: Cardiovascular Disease

## 2022-12-14 VITALS — BP 135/80 | HR 66 | Ht 68.0 in | Wt 146.2 lb

## 2022-12-14 DIAGNOSIS — R600 Localized edema: Secondary | ICD-10-CM | POA: Diagnosis not present

## 2022-12-14 DIAGNOSIS — I10 Essential (primary) hypertension: Secondary | ICD-10-CM | POA: Diagnosis not present

## 2022-12-14 DIAGNOSIS — R42 Dizziness and giddiness: Secondary | ICD-10-CM

## 2022-12-14 DIAGNOSIS — M79605 Pain in left leg: Secondary | ICD-10-CM

## 2022-12-14 DIAGNOSIS — E782 Mixed hyperlipidemia: Secondary | ICD-10-CM

## 2022-12-14 DIAGNOSIS — I739 Peripheral vascular disease, unspecified: Secondary | ICD-10-CM | POA: Diagnosis not present

## 2022-12-14 DIAGNOSIS — M79604 Pain in right leg: Secondary | ICD-10-CM

## 2022-12-14 MED ORDER — ATORVASTATIN CALCIUM 10 MG PO TABS: 10.0000 mg | ORAL_TABLET | Freq: Every day | ORAL | 3 refills | Status: DC

## 2022-12-14 NOTE — Patient Instructions (Addendum)
Medication Instructions:  Please start atorvatatin 10 mg daily Ok to start every other day  If you need a refill on your cardiac medications before your next appointment, please call your pharmacy.   Lab work: No new labs needed  Testing/Procedures: No new testing needed  Follow-Up: At [facility], you and your health needs are our priority.  As part of our continuing mission to provide you with exceptional heart care, we have created designated Provider Care Teams.  These Care Teams include your primary Cardiologist (physician) and Advanced Practice Providers (APPs -  Physician Assistants and Nurse Practitioners) who all work together to provide you with the care you need, when you need it.  You will need a follow up appointment in 12 months  Providers on your designated Care Team:   Murray Hodgkins, NP Christell Faith, PA-C Cadence Kathlen Mody, [place]  COVID-19 Vaccine Information can be found at: [email or web address].uk For questions related to vaccine distribution or appointments, please email [email or web address] or call [phone].

## 2022-12-29 ENCOUNTER — Other Ambulatory Visit: Payer: Self-pay | Admitting: Family Medicine

## 2022-12-29 DIAGNOSIS — R911 Solitary pulmonary nodule: Secondary | ICD-10-CM

## 2022-12-31 ENCOUNTER — Other Ambulatory Visit: Payer: Self-pay | Admitting: Family Medicine

## 2022-12-31 DIAGNOSIS — R911 Solitary pulmonary nodule: Secondary | ICD-10-CM

## 2023-01-18 ENCOUNTER — Other Ambulatory Visit: Payer: Self-pay | Admitting: Cardiovascular Disease

## 2023-03-02 ENCOUNTER — Ambulatory Visit (INDEPENDENT_AMBULATORY_CARE_PROVIDER_SITE_OTHER): Payer: Medicare Other | Admitting: Podiatry

## 2023-03-02 ENCOUNTER — Encounter: Payer: Self-pay | Admitting: Podiatry

## 2023-03-02 DIAGNOSIS — B351 Tinea unguium: Secondary | ICD-10-CM | POA: Diagnosis not present

## 2023-03-02 DIAGNOSIS — M79674 Pain in right toe(s): Secondary | ICD-10-CM | POA: Diagnosis not present

## 2023-03-02 DIAGNOSIS — D2372 Other benign neoplasm of skin of left lower limb, including hip: Secondary | ICD-10-CM

## 2023-03-02 NOTE — Progress Notes (Signed)
She presents today chief complaint of painful elongated toenails and painful benign skin lesion.  Objective: Vital signs stable oriented x 3 toenails are long thick yellow dystrophic onychomycotic pulses remain palpable.  Benign skin lesion plantar aspect of the forefoot left.  Assessment: Pain limb secondary to onychomycosis and benign skin lesion.  Plan: Debrided benign skin lesion debrided toenails 1 through 5 bilateral.

## 2023-07-28 ENCOUNTER — Ambulatory Visit: Payer: Medicare Other | Admitting: Podiatry

## 2023-07-28 ENCOUNTER — Encounter: Payer: Self-pay | Admitting: Podiatry

## 2023-07-28 ENCOUNTER — Ambulatory Visit (INDEPENDENT_AMBULATORY_CARE_PROVIDER_SITE_OTHER): Payer: Medicare Other

## 2023-07-28 ENCOUNTER — Ambulatory Visit (INDEPENDENT_AMBULATORY_CARE_PROVIDER_SITE_OTHER): Payer: Medicare Other | Admitting: Podiatry

## 2023-07-28 DIAGNOSIS — B351 Tinea unguium: Secondary | ICD-10-CM | POA: Diagnosis not present

## 2023-07-28 DIAGNOSIS — S99922A Unspecified injury of left foot, initial encounter: Secondary | ICD-10-CM

## 2023-07-28 DIAGNOSIS — M79674 Pain in right toe(s): Secondary | ICD-10-CM | POA: Diagnosis not present

## 2023-07-28 NOTE — Progress Notes (Signed)
  Subjective:  Patient ID: Breanna Hodge, female    DOB: 05/08/1941,  MRN: [id number]  Chief Complaint  Patient presents with   Toe Pain    Patient is here for toe pain and nail trim   82 y.o. female returns for the above complaint.  Patient presents with thickened onychodystrophy mycotic toenails x 10 mild pain on palpation hurts with ambulation worse with pressure patient would like me debride down she is not able to do it herself denies any other acute complaints she has a complaint of left toe pain she stopped it she wants to make sure there is no fracture  Objective:  There were no vitals filed for this visit. Podiatric Exam: Vascular: dorsalis pedis and posterior tibial pulses are palpable bilateral. Capillary return is immediate. Temperature gradient is WNL. Skin turgor WNL  Sensorium: Normal Semmes Weinstein monofilament test. Normal tactile sensation bilaterally. Nail Exam: Pt has thick disfigured discolored nails with subungual debris noted bilateral entire nail hallux through fifth toenails.  Pain on palpation to the nails. Ulcer Exam: There is no evidence of ulcer or pre-ulcerative changes or infection. Orthopedic Exam: Muscle tone and strength are WNL. No limitations in general ROM. No crepitus or effusions noted.  Pain on palpation of left fourth digit.  No open wounds or lesion noted. Skin: No Porokeratosis. No infection or ulcers    Assessment & Plan:   1. Injury of toe on left foot, initial encounter     Patient was evaluated and treated and all questions answered.  Left fourth toe fracture nondisplaced -I explained the patient the etiology of fracture adverse treatment options were discussed. -I discussed buddy splinting as well as surgical shoe.  She already has surgical shoe at home she will place herself in it.  Encouraged her to discontinue the boot once her pain is resolved she states understanding.  Onychomycosis with pain  -Nails palliatively  debrided as below. -Educated on self-care  Procedure: Nail Debridement Rationale: pain  Type of Debridement: manual, sharp debridement. Instrumentation: Nail nipper, rotary burr. Number of Nails: 10  Procedures and Treatment: Consent by patient was obtained for treatment procedures. The patient understood the discussion of treatment and procedures well. All questions were answered thoroughly reviewed. Debridement of mycotic and hypertrophic toenails, 1 through 5 bilateral and clearing of subungual debris. No ulceration, no infection noted.  Return Visit-Office Procedure: Patient instructed to return to the office for a follow up visit 3 months for continued evaluation and treatment.  Nicholes Rough, DPM    No follow-ups on file.

## 2023-11-08 ENCOUNTER — Telehealth: Payer: Self-pay | Admitting: Cardiovascular Disease

## 2023-11-08 ENCOUNTER — Encounter: Payer: Self-pay | Admitting: Podiatry

## 2023-11-08 ENCOUNTER — Other Ambulatory Visit: Payer: Self-pay | Admitting: Cardiovascular Disease

## 2023-11-08 ENCOUNTER — Ambulatory Visit: Payer: 59 | Admitting: Podiatry

## 2023-11-08 DIAGNOSIS — L909 Atrophic disorder of skin, unspecified: Secondary | ICD-10-CM | POA: Diagnosis not present

## 2023-11-08 DIAGNOSIS — D2372 Other benign neoplasm of skin of left lower limb, including hip: Secondary | ICD-10-CM | POA: Diagnosis not present

## 2023-11-08 DIAGNOSIS — M79674 Pain in right toe(s): Secondary | ICD-10-CM

## 2023-11-08 DIAGNOSIS — B351 Tinea unguium: Secondary | ICD-10-CM | POA: Diagnosis not present

## 2023-11-08 NOTE — Telephone Encounter (Signed)
Left voice mail

## 2023-11-08 NOTE — Telephone Encounter (Signed)
Please contact pt for future appointment. Pt due for future follow up.

## 2023-11-08 NOTE — Progress Notes (Signed)
Chief Complaint  Patient presents with   Nail Problem    "Trim her nails and a corn on the left foot."    SUBJECTIVE Patient presents to office today complaining of elongated, thickened nails that cause pain while ambulating in shoes.  Patient is unable to trim their own nails.  Patient also has developed a symptomatic skin lesion to the plantar aspect of the fifth MTP of the left foot.  It is very tender and painful.  Patient is here for further evaluation and treatment.  Past Medical History:  Diagnosis Date   Anemia    Arthritis    Clotting disorder (HCC)    DVT, RLE during pregnancy   Complication of anesthesia 1976   woke up during the procedure   DLE (discoid lupus erythematosus)    diagnosed at [facility] by skin biopsy   DVT (deep venous thrombosis) (H[facility])    RLE, associated with pregnancy   H/O: Bell's palsy    approx 15 yrs ago, resolved   Heart murmur    Hyperlipidemia    Hypertension    Rash    recurring on RLE, reported as discoid lupus by patient per prior [facility] eval   Wears dentures    Full upper and lower. Does NOT wear lower.    Allergies  Allergen Reactions   Naprosyn [Naproxen] Hives and Shortness Of Breath   Rosuvastatin Calcium Other (See Comments)    Pt states loss of use on rt side     Lipitor [Atorvastatin] Other (See Comments)    Muscle aches/pain (2023) History of Low bp & diarrhea (2019)   Niacin And Related Rash     OBJECTIVE General Patient is awake, alert, and oriented x 3 and in no acute distress. Derm Skin is dry and supple bilateral. Negative open lesions or macerations. Remaining integument unremarkable. Nails are tender, long, thickened and dystrophic with subungual debris, consistent with onychomycosis, 1-5 bilateral. No signs of infection noted.  Hyperkeratotic skin lesion noted to the plantar aspect of the fifth MTP left with a central nucleated core Vasc  DP and PT pedal pulses palpable bilaterally. Temperature gradient within normal  limits.  Neuro grossly intact via light touch Musculoskeletal Exam fat pad atrophy noted with associated tenderness to the metatarsal heads bilateral.  ASSESSMENT 1.  Pain due to onychomycosis of toenails both 2.  Eccrine poroma left plantar fifth MTP 3.  Fat pad atrophy with associated metatarsalgia  PLAN OF CARE 1. Patient evaluated today.  2. Instructed to maintain good pedal hygiene and foot care.  In regards to the atrophy of the fat pad to the bilateral feet, recommend good supportive tennis shoes and sneakers with arch supports to offload pressure from the forefoot.  Advised against going barefoot or wearing nonsupportive shoes 3. Mechanical debridement of nails 1-5 bilaterally performed using a nail nipper. Filed with dremel without incident.  4.  Excisional debridement of the hyperkeratotic eccrine poroma was performed today using a 312 scalpel without incident or bleeding.  Salicylic acid and a light dressing applied.   5.  Return to clinic in 3 mos. routine footcare   Felecia Shelling, DPM [facility]  Dr. Felecia Shelling, DPM    [address], [place] [address]  Office [phone]  Fax [phone]

## 2023-11-08 NOTE — Telephone Encounter (Signed)
Left voicemail, pt needs appt scheduled from recall

## 2023-11-10 NOTE — Telephone Encounter (Signed)
Left voice mail

## 2023-11-10 NOTE — Telephone Encounter (Signed)
Pt scheduled on 2/18

## 2024-01-16 NOTE — Progress Notes (Signed)
 Cardiology Office Note  Date:  01/17/2024   ID:  [name], [name] [birth year], MRN [id number]  PCP:  Rex Castor, MD   Chief Complaint  Patient presents with   12 month follow up     Patient c/o bilateral LE edema as well as leg pain with walking.     HPI:  [name] is a 83 year-old woman with past medical history of hyperlipidemia 4 yr of smoking, remote Left bundle branch block Vascular issues/varicose veins status post RLE GSV RFA on 07/18/2018. 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 CT  images with coronary calcification, aortic atherosclerosis Aortoiliac disease documented March 2023 Who presents for follow-up of her lower extremity edema, hypertension, hyperlipidemia  Prior clinic visit 3/24 Presents today with her daughter Followed by neurology, memory  Sedentary, walks her dogs, no regular exercise program No chest pain or shortness of breath on exertion Chronic lower extremity edema worse on the right than the left, wears tight socks  No syncope or near syncope  Statin intolerance, did not tolerate Lipitor Crestor On Zetia  daily  Lab work reviewed A1C 6.0 Cholesterol running in the 200 range 10 months ago  EKG personally reviewed by myself on todays visit EKG Interpretation Date/Time:  Tuesday January 17 2024 09:12:41 EDT Ventricular Rate:  84 PR Interval:  194 QRS Duration:  92 QT Interval:  376 QTC Calculation: 444 R Axis:   11  Text Interpretation: Normal sinus rhythm with sinus arrhythmia Inferior infarct (cited on or before 16-Jul-2022) Anteroseptal infarct (cited on or before 16-Jul-2022) When compared with ECG of 16-Jul-2022 08:18, QRS axis Shifted right Questionable change in initial forces of Septal leads Confirmed by Belva Boyden [phone]) on 01/17/2024 9:26:35 AM   Other past medical history reviewed 07/2020 was walking her dog, near syncope, syncope Friend had to pick her up, Was  told "low sugar" Was not eating much at the time  zio 10/21 Normal sinus rhythm avg HR of 71 bpm.    73 Supraventricular Tachycardia runs occurred, the run with the fastest interval lasting 7 beats with a max rate of 214 bpm, the longest lasting 13.2 secs with an avg rate of 124 bpm.  No patient triggers   Isolated SVEs were occasional (2.0%, 62130), SVE Couplets were rare (<1.0%, 454), and SVE Triplets were rare (<1.0%, 84).    Isolated VEs were occasional (1.7%, 22405), VE Couplets were rare (<1.0%, 27), and VE Triplets were rare (<1.0%, 1). Ventricular Bigeminy and Trigeminy were present.   Rare patient triggered events associated with PACs, PVCs  Stress test February 2021 reviewed perfusion defect concerning for artifact apical region CT attenuation correction images with coronary calcification, aortic atherosclerosis  LE arterial doppler 05/2018 No hemodynamically significant arterial disease in bilateral lower  Extremities.  Stress test 03/2018 IMPRESSION 1. ABNORMAL STUDY. 2. SMALL ZONE OF MILD APICAL ISCHEMIA. 3. THIS STUDY IS REPORTED AT [facility]  [facility] WITHOUT PATIENT  SYMPTOMS    OF CHEST PAIN, AND WITH NEGATIVE ST SEGMENT RESPONSE.  GATED STUDY 1. LEFT VENTRICULAR GATED STUDY DEMONSTRATES NORMAL LEFT VENTRICULAR    FUNCTION. 2. ESTIMATED LEFT VENTRICULAR EJECTION FRACTION IS 48%. 3. LEFT VENTRICULAR WALL MOTION IS NORMAL.   PMH:   has a past medical history of Anemia, Arthritis, Clotting disorder (HCC), Complication of anesthesia (1976), DLE (discoid lupus erythematosus), DVT (deep venous thrombosis) (HCC), H/O: Bell's palsy, Heart murmur, Hyperlipidemia, Hypertension, Rash, and Wears dentures.  PSH:    Past Surgical History:  Procedure  Laterality Date   ABDOMINAL HYSTERECTOMY  1985   with bladder tack using mesh   ANTERIOR AND POSTERIOR REPAIR Bilateral 03/24/2020   Procedure: ANTERIOR (CYSTOCELE) AND POSTEROR REPAIR (RECTOCELE);  Surgeon: Zenobia Hila, MD;  Location: [facility] ORS;  Service: Gynecology;  Laterality: Bilateral;   CATARACT EXTRACTION W/PHACO Left 07/14/2015   Procedure: CATARACT EXTRACTION PHACO AND INTRAOCULAR LENS PLACEMENT (IOC);  Surgeon: Billee Buddle, MD;  Location: [facility];  Service: Ophthalmology;  Laterality: Left;  PER DR OFFICE PT WANTS EARLY AS POSSIBLE   CATARACT EXTRACTION W/PHACO Right 08/25/2015   Procedure: CATARACT EXTRACTION PHACO AND INTRAOCULAR LENS PLACEMENT (IOC);  Surgeon: Billee Buddle, MD;  Location: [facility];  Service: Ophthalmology;  Laterality: Right;  PER DR OFFICE PT WANTS AS EARLY AS POSSIBLE   CHOLECYSTECTOMY  1987   COLONOSCOPY WITH PROPOFOL  N/A 12/12/2019   Procedure: COLONOSCOPY WITH PROPOFOL ;  Surgeon: Selena Daily, MD;  Location: [facility] END[facility];  Service: Gastroenterology;  Laterality: N/A;   EYE SURGERY     gyn surgery  1989   hysterectomy   KNEE ARTHROSCOPY  2009   right   KNEE ARTHROSCOPY  2007   right knee   vein removal  2019   Right leg    Current Outpatient Medications  Medication Sig Dispense Refill   Ascorbic Acid (VITAMIN C WITH ROSE HIPS) 500 MG tablet Take 500 mg by mouth daily.     Cholecalciferol (VITAMIN D ) 50 MCG (2000 UT) CAPS Take 2,000 Units by mouth daily.     Cobalamin Combinations (B-12) [phone] MCG SUBL daily at 6 (six) AM.     Coenzyme Q10 (COQ-10) 100 MG CAPS Take 100 mg by mouth daily.     Cranberry 450 MG TABS Take 450 mg by mouth daily.     escitalopram (LEXAPRO) 5 MG tablet Take 5 mg by mouth daily.     ezetimibe  (ZETIA ) 10 MG tablet TAKE 1 TABLET BY MOUTH EVERY DAY 90 tablet 0   lisinopril  (ZESTRIL ) 20 MG tablet Take 1 tablet (20 mg total) by mouth daily. 90 tablet 3   Multiple Vitamin (MULTIVITAMIN WITH MINERALS) TABS tablet Take 1 tablet by mouth daily. Centrum Silver     No current facility-administered medications for this visit.     Allergies:   Naprosyn [naproxen], Rosuvastatin calcium ,  Lipitor [atorvastatin ], Ciprofloxacin, and Niacin and related   Social History:  The patient  reports that she quit smoking about 63 years ago. Her smoking use included cigarettes. She started smoking about 69 years ago. She has a 24 pack-year smoking history. She has never used smokeless tobacco. She reports that she does not drink alcohol and does not use drugs.   Family History:   family history includes Cancer in her father; Coronary artery disease in her mother; Heart disease in her brother; Lupus in her daughter.    Review of Systems: Review of Systems  Constitutional: Negative.   HENT: Negative.    Respiratory: Negative.    Cardiovascular: Negative.   Gastrointestinal: Negative.   Musculoskeletal: Negative.   Neurological: Negative.   Psychiatric/Behavioral: Negative.    All other systems reviewed and are negative.   PHYSICAL EXAM: VS:  BP 120/62 (BP Location: Left Arm, Patient Position: Sitting, Cuff Size: Normal)   Pulse 84   Ht 5\' 6"  (1.676 m)   Wt 153 lb (69.4 kg)   SpO2 98%   BMI 24.69 kg/m  , BMI Body mass index is 24.69 kg/m. Constitutional:  oriented  to person, place, and time. No distress.  HENT:  Head: Grossly normal Eyes:  no discharge. No scleral icterus.  Neck: No JVD, no carotid bruits  Cardiovascular: Regular rate and rhythm, 2-3/6 SEM right sternal border Pulmonary/Chest: Clear to auscultation bilaterally, no wheezes or rails Abdominal: Soft.  no distension.  no tenderness.  Musculoskeletal: Normal range of motion Neurological:  normal muscle tone. Coordination normal. No atrophy Skin: Skin warm and dry Psychiatric: normal affect, pleasant  Recent Labs: No results found for requested labs within last 365 days.    Lipid Panel Lab Results  Component Value Date   CHOL 171 11/27/2019   HDL 62.70 11/27/2019   LDLCALC 90 11/27/2019   TRIG 93.0 11/27/2019    Wt Readings from Last 3 Encounters:  01/17/24 153 lb (69.4 kg)  12/14/22 146 lb 4 oz  (66.3 kg)  07/16/22 150 lb (68 kg)     ASSESSMENT AND PLAN:  Problem List Items Addressed This Visit       Cardiology Problems   Hyperlipidemia   Essential hypertension   Relevant Orders   EKG 12-Lead (Completed)     Other   Dizziness   Other Visit Diagnoses       PAD (peripheral artery disease) (HCC)    -  Primary   Relevant Orders   EKG 12-Lead (Completed)     Lower extremity edema         Bilateral leg pain          Dizziness, weakness Blood pressure is well controlled on today's visit. No changes made to the medications. Recommend regular walking program  Hyperlipidemia Off Crestor and lipid for secondary to muscle spasms Reports she is taking Zetia ,  Cholesterol ordered today Given coronary calcification, aortic atherosclerosis, significant lower extremity PAD, recommended more aggressive cholesterol management -If numbers continue to run high on today's lab work, recommend either bempedoic acid or PCSK9 inhibitor The above was discussed with the patient and daughter on today's visit  Lower extremity pain, cramping Stable significant lower extremity arterial disease on ultrasound early 2023 Aggressive lipid management as above  Lower extremity edema Worse on the right than the left, recommend leg elevation when sitting, compression hose  Essential hypertension Blood pressure is well controlled on today's visit. No changes made to the medications.  PAD Aortoiliac disease documented March 2023 Denies claudication symptoms Recommend aggressive lipid management  Aortic valve stenosis Significant murmur on exam, Updated echocardiogram ordered  Signed, Juanda Noon, M.D., Ph.D. [facility] [place], [place] [phone]

## 2024-01-17 ENCOUNTER — Ambulatory Visit: Payer: Medicare Other | Attending: Cardiovascular Disease | Admitting: Cardiovascular Disease

## 2024-01-17 VITALS — BP 120/62 | HR 84 | Ht 66.0 in | Wt 153.0 lb

## 2024-01-17 DIAGNOSIS — M79605 Pain in left leg: Secondary | ICD-10-CM

## 2024-01-17 DIAGNOSIS — E782 Mixed hyperlipidemia: Secondary | ICD-10-CM

## 2024-01-17 DIAGNOSIS — I10 Essential (primary) hypertension: Secondary | ICD-10-CM | POA: Diagnosis not present

## 2024-01-17 DIAGNOSIS — I739 Peripheral vascular disease, unspecified: Secondary | ICD-10-CM

## 2024-01-17 DIAGNOSIS — I350 Nonrheumatic aortic (valve) stenosis: Secondary | ICD-10-CM

## 2024-01-17 DIAGNOSIS — M79604 Pain in right leg: Secondary | ICD-10-CM

## 2024-01-17 DIAGNOSIS — R600 Localized edema: Secondary | ICD-10-CM

## 2024-01-17 DIAGNOSIS — R42 Dizziness and giddiness: Secondary | ICD-10-CM

## 2024-01-17 NOTE — Patient Instructions (Addendum)
 Medication Instructions:  No changes  Research two cholesterol medications 1: Bempedoic acid 2: repatha or praluent  If you need a refill on your cardiac medications before your next appointment, please call your pharmacy.   Lab work: Lipids today  Testing/Procedures: Your physician has requested that you have an echocardiogram. Echocardiography is a painless test that uses sound waves to create images of your heart. It provides your doctor with information about the size and shape of your heart and how well your heart's chambers and valves are working.   You may receive an ultrasound enhancing agent through an IV if needed to better visualize your heart during the echo. This procedure takes approximately one hour.  There are no restrictions for this procedure.  This will take place at [address] [facility]) [address], [place] [address]  Please note: We ask at that you not bring children with you during ultrasound (echo/ vascular) testing. Due to room size and safety concerns, children are not allowed in the ultrasound rooms during exams. Our front office staff cannot provide observation of children in our lobby area while testing is being conducted. An adult accompanying a patient to their appointment will only be allowed in the ultrasound room at the discretion of the ultrasound technician under special circumstances. We apologize for any inconvenience.   Follow-Up: At [facility], you and your health needs are our priority.  As part of our continuing mission to provide you with exceptional heart care, we have created designated Provider Care Teams.  These Care Teams include your primary Cardiologist (physician) and Advanced Practice Providers (APPs -  Physician Assistants and Nurse Practitioners) who all work together to provide you with the care you need, when you need it.  You will need a follow up appointment in 12 months  Providers on your designated Care Team:    Laneta Pintos, NP Varney Gentleman, PA-C Cadence Gennaro Khat, [place]  COVID-19 Vaccine Information can be found at: [email or web address] For questions related to vaccine distribution or appointments, please email [email or web address] or call [phone].

## 2024-01-18 LAB — LIPID PANEL
Chol/HDL Ratio: 3.3 ratio (ref 0.0–4.4)
Cholesterol, Total: 196 mg/dL (ref 100–199)
HDL: 59 mg/dL (ref >39)
LDL Chol Calc (NIH): 118 mg/dL — ABNORMAL HIGH (ref 0–99)
Triglycerides: 104 mg/dL (ref 0–149)
VLDL Cholesterol Cal: 19 mg/dL (ref 5–40)

## 2024-01-20 ENCOUNTER — Encounter: Payer: Self-pay | Admitting: Cardiovascular Disease

## 2024-01-24 MED ORDER — REPATHA SURECLICK 140 MG/ML ~~LOC~~ SOAJ: 140.0000 mg | SUBCUTANEOUS | 3 refills | Status: AC

## 2024-01-24 NOTE — Telephone Encounter (Signed)
 Daughter is returning call for results because is unsure of what was said. Please call daughter Breanna Hodge [phone]

## 2024-01-24 NOTE — Telephone Encounter (Signed)
 Called and spoke daughter per DPR and notified her of the following from Dr. Gollan.  Lipid panel Numbers are above goal, 196 Would suggest we start PCSK9 inhibitor, Repatha 140 mg shot every 2 weeks  Daughter verbalizes understanding. Prescription sent to preferred pharmacy.

## 2024-01-25 ENCOUNTER — Encounter: Payer: Self-pay | Admitting: Pharmacy Technician

## 2024-01-25 ENCOUNTER — Telehealth: Payer: Self-pay | Admitting: Pharmacy Technician

## 2024-01-25 ENCOUNTER — Other Ambulatory Visit (HOSPITAL_COMMUNITY): Payer: Self-pay

## 2024-01-25 ENCOUNTER — Other Ambulatory Visit: Payer: Self-pay | Admitting: Cardiovascular Disease

## 2024-01-25 NOTE — Telephone Encounter (Signed)
 Patient Advocate Encounter   The patient was approved for a [employer] grant that will help cover the cost of repatha Total amount awarded, 2500.00.  Effective: 12/26/23 - 12/24/24   [id number] WUJ:[id number] [id number] [employer] ID: [id number]   Pharmacy provided with approval and processing information. Patient informed via telephone/mychart

## 2024-01-25 NOTE — Telephone Encounter (Signed)
 Pharmacy Patient Advocate Encounter   Received notification from Onbase that prior authorization for REPATHA is required/requested.   Insurance verification completed.   The patient is insured through [facility] .   Per test claim: PA required; PA submitted to above mentioned insurance via CoverMyMeds Key/confirmation #/EOC [id number] Status is pending

## 2024-01-25 NOTE — Telephone Encounter (Signed)
 Pharmacy Patient Advocate Encounter  Received notification from [employer] that Prior Authorization for REPATHA has been APPROVED from 01/25/24 to 07/26/24. Spoke to pharmacy to process.Copay is $396.00 FOR 90 DAYS.    PA #/Case ID/Reference #: [id number]    I got healthwell grant and spoke daughter

## 2024-02-09 ENCOUNTER — Encounter: Payer: Self-pay | Admitting: Podiatry

## 2024-02-09 ENCOUNTER — Ambulatory Visit: Payer: Medicare Other | Admitting: Podiatry

## 2024-02-09 DIAGNOSIS — M79674 Pain in right toe(s): Secondary | ICD-10-CM

## 2024-02-09 DIAGNOSIS — B351 Tinea unguium: Secondary | ICD-10-CM

## 2024-02-09 DIAGNOSIS — D689 Coagulation defect, unspecified: Secondary | ICD-10-CM | POA: Diagnosis not present

## 2024-02-09 DIAGNOSIS — Q828 Other specified congenital malformations of skin: Secondary | ICD-10-CM

## 2024-02-09 NOTE — Progress Notes (Signed)
  Subjective:  Patient ID: Breanna Hodge, female    DOB: 1941/03/11,  MRN: [id number]  83 y.o. female presents at risk foot care with h/o clotting disorder and painful porokeratotic lesion(s) left foot and painful mycotic toenails that limit ambulation. Painful toenails interfere with ambulation. Aggravating factors include wearing enclosed shoe gear. Pain is relieved with periodic professional debridement. Painful porokeratotic lesions are aggravated when weightbearing with and without shoegear. Pain is relieved with periodic professional debridement. Chief Complaint  Patient presents with   Nail Problem    "The nails"   New problem(s): None   PCP is Rex Castor, MD , and last visit was December 22, 2023.  Allergies  Allergen Reactions   Naprosyn [Naproxen] Hives and Shortness Of Breath   Rosuvastatin Calcium  Other (See Comments)    Pt states loss of use on rt side     Lipitor [Atorvastatin ] Other (See Comments)    Muscle aches/pain (2023) History of Low bp & diarrhea (2019)   Ciprofloxacin Anxiety, Nausea Only, Other (See Comments) and Swelling   Niacin And Related Rash    Review of Systems: Negative except as noted in the HPI.   Objective:  Breanna Hodge is a pleasant 83 y.o. female WD, WN in NAD. AAO x 3.  Vascular Examination: Vascular status intact b/l with palpable pedal pulses. CFT immediate b/l. Pedal hair present. No edema. No pain with calf compression b/l. Skin temperature gradient WNL b/l. No varicosities noted. No cyanosis or clubbing noted.  Neurological Examination: Sensation grossly intact b/l with 10 gram monofilament. Vibratory sensation intact b/l.  Dermatological Examination: Pedal skin with normal turgor, texture and tone b/l. No open wounds nor interdigital macerations noted. Toenails 1-5 b/l thick, discolored, elongated with subungual debris and pain on dorsal palpation. Porokeratotic lesion(s) submet head 5 left foot. No erythema, no  edema, no drainage, no fluctuance.  Musculoskeletal Examination: Muscle strength 5/5 to b/l LE.  No pain, crepitus noted b/l. Plantarflexed metatarsal(s) 5th metatarsal head left foot. Plantar fat pad atrophy of forefoot area of both feet.  Radiographs: None  Last A1c:       No data to display           Assessment:   1. Pain due to onychomycosis of toenail of right foot   2. Porokeratosis   3. Clotting disorder [facility])    Plan:  Patient was evaluated and treated. All patient's and/or POA's questions/concerns addressed on today's visit. Toenails 1-5 debrided in length and girth without incident. Porokeratotic lesion(s) submet head 5 left foot pared with sharp debridement without incident. Continue soft, supportive shoe gear daily. Report any pedal injuries to medical professional. Call office if there are any questions/concerns. -Patient/POA to call should there be question/concern in the interim.  Return in about 3 months (around 05/11/2024).  Luella Sager, DPM      [place] LOCATION: [address], [place] [address]                   Office [phone]   [facility] LOCATION: [address] [place], [place] [address] Office [phone]

## 2024-02-13 ENCOUNTER — Encounter: Payer: Self-pay | Admitting: Podiatry

## 2024-02-16 ENCOUNTER — Ambulatory Visit

## 2024-02-28 ENCOUNTER — Encounter: Payer: Self-pay | Admitting: Cardiovascular Disease

## 2024-02-29 ENCOUNTER — Ambulatory Visit: Attending: Cardiovascular Disease

## 2024-02-29 DIAGNOSIS — I350 Nonrheumatic aortic (valve) stenosis: Secondary | ICD-10-CM

## 2024-02-29 LAB — ECHOCARDIOGRAM COMPLETE
AR max vel: 0.97 cm2
AV Area VTI: 0.93 cm2
AV Area mean vel: 0.93 cm2
AV Mean grad: 17 mmHg
AV Peak grad: 30.4 mmHg
Ao pk vel: 2.76 m/s
Area-P 1/2: 3.65 cm2
Calc EF: 54 %
S' Lateral: 2.9 cm
Single Plane A2C EF: 54.5 %
Single Plane A4C EF: 52.4 %

## 2024-03-11 ENCOUNTER — Ambulatory Visit: Payer: Self-pay | Admitting: Cardiovascular Disease

## 2024-03-11 DIAGNOSIS — I350 Nonrheumatic aortic (valve) stenosis: Secondary | ICD-10-CM

## 2024-05-14 ENCOUNTER — Ambulatory Visit: Admitting: Podiatry

## 2024-05-14 ENCOUNTER — Encounter: Payer: Self-pay | Admitting: Podiatry

## 2024-05-14 DIAGNOSIS — Q828 Other specified congenital malformations of skin: Secondary | ICD-10-CM

## 2024-05-14 DIAGNOSIS — D689 Coagulation defect, unspecified: Secondary | ICD-10-CM | POA: Diagnosis not present

## 2024-05-14 DIAGNOSIS — B351 Tinea unguium: Secondary | ICD-10-CM | POA: Diagnosis not present

## 2024-05-14 DIAGNOSIS — M79674 Pain in right toe(s): Secondary | ICD-10-CM | POA: Diagnosis not present

## 2024-05-20 ENCOUNTER — Encounter: Payer: Self-pay | Admitting: Podiatry

## 2024-05-20 NOTE — Progress Notes (Signed)
  Subjective:  Patient ID: Breanna Hodge, female    DOB: 24-Feb-1941,  MRN: [id number]  ROSE HEGNER presents to clinic today for at risk foot care with h/o clotting disorder and painful porokeratotic lesion(s) left lower extremity and painful mycotic toenails that limit ambulation. Painful toenails interfere with ambulation. Aggravating factors include wearing enclosed shoe gear. Pain is relieved with periodic professional debridement. Painful porokeratotic lesions are aggravated when weightbearing with and without shoegear. Pain is relieved with periodic professional debridement.  She is accompanied by her daughter on today's visit. Chief Complaint  Patient presents with   RFC     RFC Non diabetic toenail trim. LOV with PCP 01/2024.   New problem(s): None.   PCP is Sherial Bail, MD.  Allergies  Allergen Reactions   Naprosyn [Naproxen] Hives and Shortness Of Breath   Rosuvastatin Calcium  Other (See Comments)    Pt states loss of use on rt side     Lipitor [Atorvastatin ] Other (See Comments)    Muscle aches/pain (2023) History of Low bp & diarrhea (2019)   Ciprofloxacin Anxiety, Nausea Only, Other (See Comments) and Swelling   Niacin And Related Rash    Review of Systems: Negative except as noted in the HPI.  Objective: No changes noted in today's physical examination. There were no vitals filed for this visit. Breanna Hodge is a pleasant 83 y.o. female WD, WN in NAD. AAO x 3.  Vascular Examination: Capillary refill time immediate b/l. Palpable pedal pulses. Pedal hair present b/l. Pedal edema absent. No pain with calf compression b/l. Skin temperature gradient WNL b/l. No cyanosis or clubbing b/l. No ischemia or gangrene noted b/l.   Neurological Examination: Sensation grossly intact b/l with 10 gram monofilament.  Dermatological Examination: Pedal skin with normal turgor, texture and tone b/l.  No open wounds. No interdigital macerations.    Toenails 1-5 b/l thick, discolored, elongated with subungual debris and pain on dorsal palpation.   Porokeratotic lesion(s) submet head 1 left foot. No erythema, no edema, no drainage, no fluctuance.  Musculoskeletal Examination: Muscle strength 5/5 to all lower extremity muscle groups bilaterally. Plantarflexed metatarsal(s) 1st metatarsal head left foot. Plantar fat pad atrophy of forefoot area of both feet.  Radiographs: None  Assessment/Plan: 1. Pain due to onychomycosis of toenail of right foot   2. Porokeratosis   3. Clotting disorder [facility])   Consent given for treatment. Patient examined. All patient's and/or POA's questions/concerns addressed on today's visit. Mycotic toenails 1-5 debrided in length and girth without incident. Porokeratotic lesion(s) submet head 1 left foot pared and enucleated with sharp debridement without incident.Continue soft, supportive shoe gear daily. Report any pedal injuries to medical professional. Call office if there are any quesitons/concerns.  Return in about 3 months (around 08/14/2024).  Delon LITTIE Merlin, DPM      [place] LOCATION: [address] [place] [address]                   Office [phone]   [facility] LOCATION: [address] [place], [place] [address] Office [phone]

## 2024-06-12 DIAGNOSIS — G309 Alzheimer's disease, unspecified: Secondary | ICD-10-CM | POA: Insufficient documentation

## 2024-08-16 ENCOUNTER — Encounter: Payer: Self-pay | Admitting: Podiatry

## 2024-08-16 ENCOUNTER — Ambulatory Visit: Admitting: Podiatry

## 2024-08-16 DIAGNOSIS — D689 Coagulation defect, unspecified: Secondary | ICD-10-CM | POA: Diagnosis not present

## 2024-08-16 DIAGNOSIS — M79674 Pain in right toe(s): Secondary | ICD-10-CM | POA: Diagnosis not present

## 2024-08-16 DIAGNOSIS — Q828 Other specified congenital malformations of skin: Secondary | ICD-10-CM

## 2024-08-16 DIAGNOSIS — B351 Tinea unguium: Secondary | ICD-10-CM

## 2024-08-25 NOTE — Progress Notes (Signed)
  Subjective:  Patient ID: Breanna Hodge, female    DOB: Aug 16, 1941,  MRN: [id number]  Breanna Hodge presents to clinic today for at risk foot care with h/o clotting disorder and painful porokeratotic lesion(s) left lower extremity and painful mycotic toenails that limit ambulation. Painful toenails interfere with ambulation. Aggravating factors include wearing enclosed shoe gear. Pain is relieved with periodic professional debridement. Painful porokeratotic lesions are aggravated when weightbearing with and without shoegear. Pain is relieved with periodic professional debridement.  Chief Complaint  Patient presents with   Nail Problem    Thick painful toenails, 3 month follow up    New problem(s): None.   PCP is Sherial Bail, MD.  Allergies  Allergen Reactions   Naprosyn [Naproxen] Hives and Shortness Of Breath   Rosuvastatin Calcium  Other (See Comments)    Pt states loss of use on rt side     Lipitor [Atorvastatin ] Other (See Comments)    Muscle aches/pain (2023) History of Low bp & diarrhea (2019)   Evolocumab  Other (See Comments)    Leg swelling   Ciprofloxacin Anxiety, Nausea Only, Other (See Comments) and Swelling   Niacin And Related Rash    Review of Systems: Negative except as noted in the HPI.  Objective: No changes noted in today's physical examination. There were no vitals filed for this visit. Breanna Hodge is a pleasant 83 y.o. female WD, WN in NAD. AAO x 3.  Vascular Examination: Capillary refill time immediate b/l. Palpable pedal pulses. Pedal hair present b/l. Pedal edema absent. No pain with calf compression b/l. Skin temperature gradient WNL b/l. No cyanosis or clubbing b/l. No ischemia or gangrene noted b/l.   Neurological Examination: Sensation grossly intact b/l with 10 gram monofilament.  Dermatological Examination: Pedal skin with normal turgor, texture and tone b/l.  No open wounds. No interdigital macerations.    Toenails 1-5 b/l thick, discolored, elongated with subungual debris and pain on dorsal palpation.   Porokeratotic lesion(s) submet head 1 left foot. No erythema, no edema, no drainage, no fluctuance.  Musculoskeletal Examination: Muscle strength 5/5 to all lower extremity muscle groups bilaterally. Plantarflexed metatarsal(s) 1st metatarsal head left foot. Plantar fat pad atrophy of forefoot area of both feet.  Radiographs: None  Assessment/Plan: 1. Pain due to onychomycosis of toenail of right foot   2. Porokeratosis   3. Clotting disorder   Consent given for treatment. Patient examined. All patient's and/or POA's questions/concerns addressed on today's visit. Toenails 1-5 b/l debrided in length and girth without incident. Porokeratotic lesion(s) submet head 1 left foot and submet head 5 left foot pared and enucleated with sharp debridement without incident. Continue soft, supportive shoe gear daily. Report any pedal injuries to medical professional. Call office if there are any questions/concerns.  Return in about 3 months (around 11/16/2024).  Breanna Hodge, DPM      [place] LOCATION: [address], [place] [address]                   Office [phone]   [facility] LOCATION: [address] [place], [place] [address] Office [phone]

## 2024-11-19 ENCOUNTER — Ambulatory Visit: Admitting: Podiatry
